# Patient Record
Sex: Female | Born: 1950 | Race: White | Hispanic: No | Smoking: Former smoker
Health system: Southern US, Community
[De-identification: ages and names within clinical notes are randomized; demographics above are authoritative.]

## PROBLEM LIST (undated history)

## (undated) DIAGNOSIS — E039 Hypothyroidism, unspecified: Secondary | ICD-10-CM

## (undated) DIAGNOSIS — F419 Anxiety disorder, unspecified: Secondary | ICD-10-CM

## (undated) DIAGNOSIS — Z720 Tobacco use: Secondary | ICD-10-CM

## (undated) DIAGNOSIS — K219 Gastro-esophageal reflux disease without esophagitis: Secondary | ICD-10-CM

## (undated) HISTORY — DX: Hypothyroidism, unspecified: E03.9

## (undated) HISTORY — DX: Anxiety disorder, unspecified: F41.9

## (undated) HISTORY — DX: Gastro-esophageal reflux disease without esophagitis: K21.9

## (undated) HISTORY — DX: Tobacco use: Z72.0

---

## 2002-04-09 ENCOUNTER — Emergency Department (HOSPITAL_COMMUNITY): Admission: EM | Admit: 2002-04-09 | Discharge: 2002-04-09 | Payer: Self-pay | Admitting: Emergency Medicine

## 2003-04-30 ENCOUNTER — Encounter
Admission: RE | Admit: 2003-04-30 | Discharge: 2003-07-29 | Payer: Self-pay | Admitting: Physical Medicine and Rehabilitation

## 2005-11-10 ENCOUNTER — Ambulatory Visit: Payer: Self-pay | Admitting: Endocrinology

## 2007-03-10 ENCOUNTER — Encounter: Payer: Self-pay | Admitting: Endocrinology

## 2007-03-10 DIAGNOSIS — E039 Hypothyroidism, unspecified: Secondary | ICD-10-CM

## 2007-03-10 DIAGNOSIS — F411 Generalized anxiety disorder: Secondary | ICD-10-CM | POA: Insufficient documentation

## 2011-01-13 ENCOUNTER — Encounter: Payer: Self-pay | Admitting: Family Medicine

## 2011-01-13 DIAGNOSIS — E559 Vitamin D deficiency, unspecified: Secondary | ICD-10-CM | POA: Insufficient documentation

## 2011-02-17 ENCOUNTER — Other Ambulatory Visit: Payer: Self-pay | Admitting: Family Medicine

## 2011-02-19 ENCOUNTER — Other Ambulatory Visit: Payer: Self-pay

## 2012-03-08 ENCOUNTER — Other Ambulatory Visit: Payer: Self-pay | Admitting: Family Medicine

## 2012-03-08 ENCOUNTER — Ambulatory Visit
Admission: RE | Admit: 2012-03-08 | Discharge: 2012-03-08 | Disposition: A | Discharge status: Home / Self Care | Payer: 59 | Source: Ambulatory Visit | Attending: Family Medicine | Admitting: Family Medicine

## 2012-03-08 DIAGNOSIS — F17200 Nicotine dependence, unspecified, uncomplicated: Secondary | ICD-10-CM

## 2012-03-08 DIAGNOSIS — R0989 Other specified symptoms and signs involving the circulatory and respiratory systems: Secondary | ICD-10-CM

## 2012-03-08 DIAGNOSIS — R05 Cough: Secondary | ICD-10-CM

## 2012-10-15 ENCOUNTER — Other Ambulatory Visit: Payer: Self-pay | Admitting: Physician Assistant

## 2012-10-17 NOTE — Telephone Encounter (Signed)
Medication refilled per protocol. 

## 2012-10-17 NOTE — Telephone Encounter (Signed)
Approved. #90+2 additional refills 

## 2012-10-17 NOTE — Telephone Encounter (Addendum)
Last  Refill 09/13/12.  Clonazepam 0.5mg  TID prn #90.  Last ov 08/24/12 Need approval for controlled medication.

## 2012-10-18 ENCOUNTER — Other Ambulatory Visit: Payer: Self-pay | Admitting: Physician Assistant

## 2012-10-18 NOTE — Telephone Encounter (Signed)
Medication refilled per protocol. 

## 2012-10-28 ENCOUNTER — Telehealth: Payer: Self-pay | Admitting: Physician Assistant

## 2012-10-28 MED ORDER — OMEPRAZOLE 20 MG PO CPDR: 20.0000 mg | DELAYED_RELEASE_CAPSULE | Freq: Every day | ORAL | Status: DC

## 2012-10-28 NOTE — Telephone Encounter (Signed)
Pt called stated pharmacy did not receive refill order, refilled order today.

## 2012-11-22 ENCOUNTER — Ambulatory Visit: Payer: Self-pay | Admitting: Family Medicine

## 2013-01-13 ENCOUNTER — Other Ambulatory Visit: Payer: Self-pay | Admitting: Physician Assistant

## 2013-01-13 NOTE — Telephone Encounter (Signed)
Last OV 08/24/12.  Last refill #90 + 2 refills 10/15/12.  Last saw MBD 09/2011 after that seeing Dr Modesto Charon.  Annual exam due October.  Hx General Anxiety Disorder. Need approval for controlled medication.

## 2013-01-13 NOTE — Telephone Encounter (Signed)
Refills called into pharmacy

## 2013-01-13 NOTE — Telephone Encounter (Signed)
Ok to refill 

## 2013-03-23 ENCOUNTER — Telehealth: Payer: Self-pay | Admitting: Family Medicine

## 2013-03-23 NOTE — Telephone Encounter (Signed)
Can u give me lov on this pt pls! Thanks

## 2013-03-23 NOTE — Telephone Encounter (Signed)
?   OK to Refill  

## 2013-03-23 NOTE — Telephone Encounter (Signed)
Klonopin 0.5 mg 1 TID prn #90

## 2013-03-23 NOTE — Telephone Encounter (Signed)
NTBS.

## 2013-03-23 NOTE — Telephone Encounter (Signed)
08/24/12 with Dr. Modesto Charon for problem..03/31/12 with Dr. Modesto Charon for a cpe

## 2013-03-24 NOTE — Telephone Encounter (Signed)
Patient aware and appt made 

## 2013-03-27 ENCOUNTER — Ambulatory Visit (INDEPENDENT_AMBULATORY_CARE_PROVIDER_SITE_OTHER): Payer: 59 | Admitting: Family Medicine

## 2013-03-27 ENCOUNTER — Encounter: Payer: Self-pay | Admitting: Family Medicine

## 2013-03-27 VITALS — BP 120/80 | HR 80 | Temp 98.0°F | Resp 20 | Wt 152.5 lb

## 2013-03-27 DIAGNOSIS — S8990XA Unspecified injury of unspecified lower leg, initial encounter: Secondary | ICD-10-CM

## 2013-03-27 DIAGNOSIS — F17200 Nicotine dependence, unspecified, uncomplicated: Secondary | ICD-10-CM

## 2013-03-27 DIAGNOSIS — Z0000 Encounter for general adult medical examination without abnormal findings: Secondary | ICD-10-CM

## 2013-03-27 DIAGNOSIS — Z13220 Encounter for screening for lipoid disorders: Secondary | ICD-10-CM

## 2013-03-27 DIAGNOSIS — F411 Generalized anxiety disorder: Secondary | ICD-10-CM

## 2013-03-27 DIAGNOSIS — Z23 Encounter for immunization: Secondary | ICD-10-CM

## 2013-03-27 DIAGNOSIS — E039 Hypothyroidism, unspecified: Secondary | ICD-10-CM

## 2013-03-27 DIAGNOSIS — L0292 Furuncle, unspecified: Secondary | ICD-10-CM

## 2013-03-27 DIAGNOSIS — S99922A Unspecified injury of left foot, initial encounter: Secondary | ICD-10-CM

## 2013-03-27 MED ORDER — CEPHALEXIN 500 MG PO CAPS: 500.0000 mg | ORAL_CAPSULE | Freq: Two times a day (BID) | ORAL | Status: DC

## 2013-03-27 MED ORDER — CLONAZEPAM 0.5 MG PO TABS: ORAL_TABLET | ORAL | Status: DC

## 2013-03-27 NOTE — Patient Instructions (Addendum)
Continue current  Medications Klonopin refilled Keflex for boil  Get the cholesterol done at [facility] center F/U as previous for GYN

## 2013-03-28 ENCOUNTER — Encounter: Payer: Self-pay | Admitting: Family Medicine

## 2013-03-28 DIAGNOSIS — L0292 Furuncle, unspecified: Secondary | ICD-10-CM | POA: Insufficient documentation

## 2013-03-28 DIAGNOSIS — S99929A Unspecified injury of unspecified foot, initial encounter: Secondary | ICD-10-CM | POA: Insufficient documentation

## 2013-03-28 DIAGNOSIS — F17200 Nicotine dependence, unspecified, uncomplicated: Secondary | ICD-10-CM | POA: Insufficient documentation

## 2013-03-28 LAB — CBC WITH DIFFERENTIAL/PLATELET
Basophils Relative: 1 % (ref 0–1)
Eosinophils Absolute: 0.1 10*3/uL (ref 0.0–0.7)
HCT: 35.8 % — ABNORMAL LOW (ref 36.0–46.0)
Hemoglobin: 12.1 g/dL (ref 12.0–15.0)
Lymphs Abs: 2.3 10*3/uL (ref 0.7–4.0)
MCH: 33.5 pg (ref 26.0–34.0)
MCHC: 33.8 g/dL (ref 30.0–36.0)
Monocytes Absolute: 0.5 10*3/uL (ref 0.1–1.0)
Monocytes Relative: 6 % (ref 3–12)
RBC: 3.61 MIL/uL — ABNORMAL LOW (ref 3.87–5.11)

## 2013-03-28 LAB — COMPREHENSIVE METABOLIC PANEL
AST: 24 U/L (ref 0–37)
Alkaline Phosphatase: 53 U/L (ref 39–117)
BUN: 13 mg/dL (ref 6–23)
Creat: 1.01 mg/dL (ref 0.50–1.10)
Glucose, Bld: 81 mg/dL (ref 70–99)
Total Bilirubin: 0.6 mg/dL (ref 0.3–1.2)

## 2013-03-28 LAB — TSH: TSH: 1.305 u[IU]/mL (ref 0.350–4.500)

## 2013-03-28 LAB — T4, FREE: Free T4: 1.32 ng/dL (ref 0.80–1.80)

## 2013-03-28 NOTE — Assessment & Plan Note (Signed)
Doing well on meds, refilled 

## 2013-03-28 NOTE — Progress Notes (Signed)
  Subjective:    Patient ID: Wendy Mata, female    DOB: [birth year], 62 y.o.   MRN: [id number]  HPI Pt here to f/u medications. COncerned about boil in groin for past couple of days, had one in left side now resolved, little drainage earlier this week, tender to touch. Anxiety- chronic anxiety, takes klonopin three times a day, has been on for some time, tried a few SSRI did not tolerate per report.  Hypothyroidism due for TFT Toe injury- last week stumped toe, noticed pain and some swelling, pain now resolved but has bruising, no pain with touch toe, bending or walking   Review of Systems  GEN- denies fatigue, fever, weight loss,weakness, recent illness HEENT- denies eye drainage, change in vision, nasal discharge, CVS- denies chest pain, palpitations RESP- denies SOB, cough, wheeze ABD- denies N/V, change in stools, abd pain GU- denies dysuria, hematuria, dribbling, incontinence MSK- denies joint pain, muscle aches, injury Neuro- denies headache, dizziness, syncope, seizure activity      Objective:   Physical Exam GEN- NAD, alert and oriented x3 HEENT- PERRL, EOMI, non injected sclera, pink conjunctiva, MMM, oropharynx clear Neck- Supple, no thyromegaly CVS- RRR, no murmur RESP-CTAB Skin- Right groin small boil, no drainage, minimal fluctuance, mild erythema EXT- No edema, left foot 4th digit +bruising, no pain on palpation of toe, normal ROM, nail in tact Pulses- Radial, DP- 2+        Assessment & Plan:

## 2013-03-28 NOTE — Assessment & Plan Note (Signed)
Counseled on cessation, not ready to quit.  

## 2013-03-28 NOTE — Assessment & Plan Note (Signed)
Check TFT's continue meds

## 2013-03-28 NOTE — Assessment & Plan Note (Signed)
Keflex, warm compress

## 2013-03-28 NOTE — Assessment & Plan Note (Addendum)
I think this is soft tissue injury, no pain, ambulating normal  Hold on imaging

## 2013-03-30 ENCOUNTER — Encounter: Payer: Self-pay | Admitting: Family Medicine

## 2013-04-12 ENCOUNTER — Ambulatory Visit (INDEPENDENT_AMBULATORY_CARE_PROVIDER_SITE_OTHER): Payer: 59 | Admitting: Family Medicine

## 2013-04-12 ENCOUNTER — Encounter: Payer: Self-pay | Admitting: Family Medicine

## 2013-04-12 VITALS — BP 120/72 | HR 88 | Temp 98.5°F | Resp 20 | Ht 62.0 in | Wt 156.0 lb

## 2013-04-12 DIAGNOSIS — N951 Menopausal and female climacteric states: Secondary | ICD-10-CM

## 2013-04-12 DIAGNOSIS — Z780 Asymptomatic menopausal state: Secondary | ICD-10-CM

## 2013-04-12 DIAGNOSIS — Z0000 Encounter for general adult medical examination without abnormal findings: Secondary | ICD-10-CM

## 2013-04-12 DIAGNOSIS — Z124 Encounter for screening for malignant neoplasm of cervix: Secondary | ICD-10-CM

## 2013-04-12 NOTE — Patient Instructions (Signed)
I recommend eye visit once a year I recommend dental visit every 6 months Goal is to  Exercise 30 minutes 5 days a week We will call with cholesterol lab results and PAP smear results F/U 6 months for medications

## 2013-04-12 NOTE — Assessment & Plan Note (Signed)
On hormone therapy Will need Bone Density with Mammogram next year

## 2013-04-12 NOTE — Progress Notes (Signed)
  Subjective:    Patient ID: Wendy Mata, female    DOB: November 29, 1950, 62 y.o.   MRN: [id number]  HPI  Patient here for CPE and Pap smear. Her last Pap smear was last year which was normal. She is also up-to-date on her mammogram. Her immunizations are up-to-date . Labs were reviewed.  She's not had a colonoscopy in and is still considering this. Her lipid panel is still outstanding she will have this done this week. She is is menopausal   Review of Systems GEN- denies fatigue, fever, weight loss,weakness, recent illness HEENT- denies eye drainage, change in vision, nasal discharge, CVS- denies chest pain, palpitations RESP- denies SOB, cough, wheeze ABD- denies N/V, change in stools, abd pain GU- denies dysuria, hematuria, dribbling, incontinence MSK- denies joint pain, muscle aches, injury Neuro- denies headache, dizziness, syncope, seizure activity      Objective:   Physical Exam GEN- NAD, alert and oriented x3 HEENT- PERRL, EOMI, non injected sclera, pink conjunctiva, MMM, oropharynx clear Neck- Supple, no thryomegaly Breast- normal symmetry, no nipple inversion,no nipple drainage, no nodules or lumps felt Nodes- no axillary nodes CVS- RRR, no murmur RESP-CTAB ABD-NABS,soft,NT,ND GU- normal external genitalia, vaginal mucosa pink and moist, cervix visualized no growth, no blood form os, No discharge, no CMT, no ovarian masses, uterus normal size Rectum- Normal tone, FOBT neg, external skin tags EXT- No edema Pulses- Radial, DP- 2+        Assessment & Plan:   CPE- PAP smear done, discussed colonoscopy pt wants to wait and have done in 2015. Mammo UTD  Get FLP, f/u 6 months  Continue exercise, on calcium

## 2013-04-13 LAB — PAP THINPREP ASCUS RFLX HPV RFLX TYPE

## 2013-04-24 ENCOUNTER — Telehealth: Payer: Self-pay | Admitting: Family Medicine

## 2013-04-24 MED ORDER — LEVOTHYROXINE SODIUM 100 MCG PO TABS: 100.0000 ug | ORAL_TABLET | Freq: Every day | ORAL | Status: DC

## 2013-04-24 NOTE — Telephone Encounter (Signed)
Patient needs her Synthroid refilled . ASAP.    [facility].

## 2013-04-24 NOTE — Telephone Encounter (Signed)
RX sent

## 2013-05-23 ENCOUNTER — Encounter: Payer: Self-pay | Admitting: Family Medicine

## 2013-05-23 MED ORDER — CEPHALEXIN 500 MG PO CAPS: 500.0000 mg | ORAL_CAPSULE | Freq: Two times a day (BID) | ORAL | Status: DC

## 2013-05-23 NOTE — Telephone Encounter (Signed)
Rx sent.  Pt notified per "MyChart"

## 2013-06-01 ENCOUNTER — Telehealth: Payer: Self-pay | Admitting: Family Medicine

## 2013-06-01 ENCOUNTER — Other Ambulatory Visit: Payer: Self-pay | Admitting: *Deleted

## 2013-06-01 MED ORDER — NORETHINDRONE-ETH ESTRADIOL 1-5 MG-MCG PO TABS: 1.0000 | ORAL_TABLET | Freq: Every day | ORAL | Status: DC

## 2013-06-01 NOTE — Telephone Encounter (Signed)
Called pt and informed her medication was sent in on the 3 of December to [facility] [employer]

## 2013-06-01 NOTE — Telephone Encounter (Signed)
Pt states that pharmacy has faxed several request for a refill on her Femhrt but they have not heard back from the request  Pt would like for you to call her back and let her know what is going on  Pharmacy [facility] Call back number is [phone]

## 2013-06-19 ENCOUNTER — Encounter: Payer: Self-pay | Admitting: Family Medicine

## 2013-07-27 ENCOUNTER — Encounter: Payer: Self-pay | Admitting: Family Medicine

## 2013-07-28 MED ORDER — CLONAZEPAM 0.5 MG PO TABS: ORAL_TABLET | ORAL | Status: DC

## 2013-07-28 NOTE — Telephone Encounter (Signed)
Rx printed for provider signature and faxed to pharmacy

## 2013-09-06 ENCOUNTER — Telehealth: Payer: Self-pay | Admitting: Family Medicine

## 2013-09-06 MED ORDER — NORETHINDRONE-ETH ESTRADIOL 1-5 MG-MCG PO TABS: 1.0000 | ORAL_TABLET | Freq: Every day | ORAL | Status: DC

## 2013-09-06 NOTE — Telephone Encounter (Signed)
Refill appropriate and filled per protocol. 

## 2013-09-06 NOTE — Telephone Encounter (Signed)
Hormone medicine has not been refilled. And she is going out of town on Friday and would like to have it before she leaves. She did not know the name of the medication Pharmacy [facility] Call back number is [phone]

## 2013-10-11 ENCOUNTER — Ambulatory Visit: Payer: 59 | Admitting: Family Medicine

## 2013-10-16 ENCOUNTER — Encounter: Payer: Self-pay | Admitting: Family Medicine

## 2013-10-16 ENCOUNTER — Other Ambulatory Visit: Payer: Self-pay | Admitting: *Deleted

## 2013-10-16 MED ORDER — LEVOTHYROXINE SODIUM 100 MCG PO TABS: 100.0000 ug | ORAL_TABLET | Freq: Every day | ORAL | Status: DC

## 2013-10-16 NOTE — Telephone Encounter (Signed)
Refill appropriate and filled per protocol. 

## 2013-10-25 ENCOUNTER — Ambulatory Visit (INDEPENDENT_AMBULATORY_CARE_PROVIDER_SITE_OTHER): Payer: 59 | Admitting: Family Medicine

## 2013-10-25 ENCOUNTER — Encounter: Payer: Self-pay | Admitting: Family Medicine

## 2013-10-25 VITALS — BP 130/72 | HR 64 | Temp 97.7°F | Resp 16 | Ht 63.5 in | Wt 153.0 lb

## 2013-10-25 DIAGNOSIS — E039 Hypothyroidism, unspecified: Secondary | ICD-10-CM

## 2013-10-25 DIAGNOSIS — F411 Generalized anxiety disorder: Secondary | ICD-10-CM

## 2013-10-25 DIAGNOSIS — F17200 Nicotine dependence, unspecified, uncomplicated: Secondary | ICD-10-CM

## 2013-10-25 DIAGNOSIS — Z13220 Encounter for screening for lipoid disorders: Secondary | ICD-10-CM

## 2013-10-25 LAB — LIPID PANEL
Cholesterol: 176 mg/dL (ref 0–200)
HDL: 63 mg/dL (ref >39)
LDL Cholesterol: 96 mg/dL (ref 0–99)
Total CHOL/HDL Ratio: 2.8 Ratio
Triglycerides: 85 mg/dL (ref <150)
VLDL: 17 mg/dL (ref 0–40)

## 2013-10-25 LAB — TSH: TSH: 2.16 u[IU]/mL (ref 0.350–4.500)

## 2013-10-25 LAB — T4, FREE: FREE T4: 1.17 ng/dL (ref 0.80–1.80)

## 2013-10-25 LAB — T3, FREE: T3, Free: 2.6 pg/mL (ref 2.3–4.2)

## 2013-10-25 MED ORDER — CLONAZEPAM 0.5 MG PO TABS: ORAL_TABLET | ORAL | Status: DC

## 2013-10-25 MED ORDER — OMEPRAZOLE 20 MG PO CPDR: 20.0000 mg | DELAYED_RELEASE_CAPSULE | Freq: Every day | ORAL | Status: DC

## 2013-10-25 NOTE — Assessment & Plan Note (Signed)
Overall her anxiety is well controlled she rarely uses the Xanax. Her fatigue may be coming from her recent stressful event for her daughter's wedding.

## 2013-10-25 NOTE — Progress Notes (Signed)
Patient ID: Wendy Mata, female   DOB: 10-Dec-1950, 63 y.o.   MRN: [id number]   Subjective:    Patient ID: Wendy Mata, female    DOB: 07/17/1950, 63 y.o.   MRN: [id number]  Patient presents for F/U  is here follow chronic medical problems. She has noted that she's had some more cold and heat intolerance and typically in a little bit more fatigued therefore she would like to have her thyroid levels rechecked. She did have like going on with her daughter's wedding but this is now over and she fell/stress. She's using her klonopin but very rarely. She will like to hold off into the fall for her bone density and colonoscopy.    Review Of Systems:  GEN-+atigue, fever, weight loss,weakness, recent illness HEENT- denies eye drainage, change in vision, nasal discharge, CVS- denies chest pain, palpitations RESP- denies SOB, cough, wheeze ABD- denies N/V, change in stools, abd pain GU- denies dysuria, hematuria, dribbling, incontinence MSK- denies joint pain, muscle aches, injury Neuro- denies headache, dizziness, syncope, seizure activity       Objective:    BP 130/72  Pulse 64  Temp(Src) 97.7 F (36.5 C) (Oral)  Resp 16  Ht 5' 3.5" (1.613 m)  Wt 153 lb (69.4 kg)  BMI 26.67 kg/m2 GEN- NAD, alert and oriented x3 HEENT- PERRL, EOMI, non injected sclera, pink conjunctiva, MMM, oropharynx clear Neck- Supple, no thyromegaly CVS- RRR, no murmur RESP-CTAB EXT- No edema Pulses- Radial 2+        Assessment & Plan:      Problem List Items Addressed This Visit   Screening cholesterol level   HYPOTHYROIDISM - Primary   Relevant Orders      TSH      T3, Free      T4, Free   ANXIETY      Note: This dictation was prepared with Dragon dictation along with smaller phrase technology. Any transcriptional errors that result from this process are unintentional.

## 2013-10-25 NOTE — Patient Instructions (Signed)
Continue current medication  F/U 6 months- Physical

## 2013-10-25 NOTE — Assessment & Plan Note (Signed)
Continues to smoke continue to work on cessation

## 2013-10-25 NOTE — Assessment & Plan Note (Signed)
Recheck thyroid function testing we'll adjust medications as needed

## 2013-10-26 ENCOUNTER — Encounter: Payer: Self-pay | Admitting: Family Medicine

## 2013-11-14 ENCOUNTER — Encounter: Payer: Self-pay | Admitting: Family Medicine

## 2013-11-17 NOTE — Telephone Encounter (Signed)
I spoke with pt, there was some confusion about her last urine sample, this was not done at our last visit, though she had left a sample She wanted surveillance as she has had hematuria and protein in her urine before, and had to see urology though work up was fairly benign besides a small cystic lesion on kidney.   We both agreed to repeat her urine sample at her physical this fall,

## 2014-02-12 ENCOUNTER — Telehealth: Payer: Self-pay | Admitting: Family Medicine

## 2014-02-12 MED ORDER — CLONAZEPAM 0.5 MG PO TABS: ORAL_TABLET | ORAL | Status: DC

## 2014-02-12 NOTE — Telephone Encounter (Signed)
Patient wants to know why we denied her klonopin please call her back at [phone]

## 2014-02-12 NOTE — Telephone Encounter (Signed)
Prescription called to pharmacy on 10/25/2013, #90 with 3 refills.   Patient states that she received refill on 01/10/2014, and no further refills remained.   Call placed to pharmacy, but pharmacy was not open yet.   Will call back.

## 2014-02-12 NOTE — Telephone Encounter (Signed)
Call placed to pharmacy.   States that prescription on 4/29 was not received.   Last fill 01/09/2014.  MD made aware and approved refill.

## 2014-02-14 ENCOUNTER — Encounter: Payer: Self-pay | Admitting: Family Medicine

## 2014-02-14 ENCOUNTER — Telehealth: Payer: Self-pay | Admitting: Family Medicine

## 2014-02-14 ENCOUNTER — Ambulatory Visit (INDEPENDENT_AMBULATORY_CARE_PROVIDER_SITE_OTHER): Payer: 59 | Admitting: Family Medicine

## 2014-02-14 VITALS — BP 138/78 | HR 68 | Temp 99.2°F | Resp 14 | Ht 63.0 in | Wt 153.0 lb

## 2014-02-14 DIAGNOSIS — J209 Acute bronchitis, unspecified: Secondary | ICD-10-CM

## 2014-02-14 DIAGNOSIS — F17200 Nicotine dependence, unspecified, uncomplicated: Secondary | ICD-10-CM

## 2014-02-14 DIAGNOSIS — Z20818 Contact with and (suspected) exposure to other bacterial communicable diseases: Secondary | ICD-10-CM

## 2014-02-14 DIAGNOSIS — Z2089 Contact with and (suspected) exposure to other communicable diseases: Secondary | ICD-10-CM

## 2014-02-14 LAB — RAPID STREP SCREEN (MED CTR MEBANE ONLY): Streptococcus, Group A Screen (Direct): NEGATIVE

## 2014-02-14 MED ORDER — ALBUTEROL SULFATE HFA 108 (90 BASE) MCG/ACT IN AERS: 2.0000 | INHALATION_SPRAY | RESPIRATORY_TRACT | Status: DC | PRN

## 2014-02-14 MED ORDER — PREDNISONE 20 MG PO TABS: 40.0000 mg | ORAL_TABLET | Freq: Every day | ORAL | Status: DC

## 2014-02-14 MED ORDER — DOXYCYCLINE HYCLATE 100 MG PO TABS: 100.0000 mg | ORAL_TABLET | Freq: Two times a day (BID) | ORAL | Status: DC

## 2014-02-14 NOTE — Telephone Encounter (Signed)
Message was handled via E-mail.

## 2014-02-14 NOTE — Telephone Encounter (Signed)
Patient has already left email for dr Buelah Manis but left this message as well, that the prednisone that we wrote for her was wrong or the pharmacy was wrong please call her back at [phone]

## 2014-02-15 NOTE — Progress Notes (Signed)
Patient ID: Wendy Mata, female   DOB: 07-18-1950, 63 y.o.   MRN: [id number]   Subjective:    Patient ID: Wendy Mata, female    DOB: 11/04/1950, 63 y.o.   MRN: [id number]  Patient presents for Illness Pt here with cough with production ,wheezing, sore throat for the past week. Multiple sick contacts with work employees including strep throat. She continues to smoke. Lamonte Sakai been using OTC nyquil, her albuterol inhaler expired. Wheezing worse at night. Denies SOB with exertion.    Review Of Systems:  GEN- denies fatigue, fever, weight loss,weakness, recent illness HEENT- denies eye drainage, change in vision, nasal discharge, CVS- denies chest pain, palpitations RESP- denies SOB,+ cough, +wheeze Neuro- denies headache, dizziness, syncope, seizure activity       Objective:    BP 138/78  Pulse 68  Temp(Src) 99.2 F (37.3 C) (Oral)  Resp 14  Ht 5\' 3"  (1.6 m)  Wt 153 lb (69.4 kg)  BMI 27.11 kg/m2 GEN- NAD, alert and oriented x3 HEENT- PERRL, EOMI, non injected sclera, pink conjunctiva, MMM, oropharynx mild injection, TM clear bilat no effusion, no maxillary sinus tenderness, nares clear Neck- Supple, no LAD CVS- RRR, no murmur RESP-course BS, no wheeze, normal WOB, no rales EXT- No edema Pulses- Radial 2+          Assessment & Plan:      Problem List Items Addressed This Visit   None    Visit Diagnoses   Exposure to strep throat    -  Primary    neg strep test, multuple exposores noted    Relevant Orders       Rapid Strep Screen (Completed)    Acute bronchitis, unspecified organism        Treat with prednisone, add antibiotics based on history and smokign, mucinex DM       Note: This dictation was prepared with Dragon dictation along with smaller phrase technology. Any transcriptional errors that result from this process are unintentional.

## 2014-04-09 ENCOUNTER — Other Ambulatory Visit: Payer: Self-pay | Admitting: *Deleted

## 2014-04-09 MED ORDER — LEVOTHYROXINE SODIUM 100 MCG PO TABS: 100.0000 ug | ORAL_TABLET | Freq: Every day | ORAL | Status: DC

## 2014-04-09 NOTE — Telephone Encounter (Signed)
Received fax requesting refill on Synthroid.   Refill appropriate and filled per protocol. 

## 2014-04-18 ENCOUNTER — Telehealth: Payer: Self-pay | Admitting: Family Medicine

## 2014-04-18 MED ORDER — SYNTHROID 100 MCG PO TABS: 100.0000 ug | ORAL_TABLET | Freq: Every day | ORAL | Status: DC

## 2014-04-18 NOTE — Telephone Encounter (Signed)
Call placed to patient.   Reports that she has been trying to take generic Levothyroxine, but she has been feeling very fatigued.   New prescription sent to pharmacy for Name Brand Only Synthroid.

## 2014-04-18 NOTE — Telephone Encounter (Signed)
Patient is calling to speak with you about her synthroid, she thinks she is allergic to it  Please call her at [phone]

## 2014-05-02 ENCOUNTER — Encounter: Payer: Self-pay | Admitting: Family Medicine

## 2014-05-02 ENCOUNTER — Ambulatory Visit (INDEPENDENT_AMBULATORY_CARE_PROVIDER_SITE_OTHER): Payer: 59 | Admitting: Family Medicine

## 2014-05-02 VITALS — BP 122/78 | HR 78 | Temp 98.6°F | Resp 16 | Ht 63.0 in | Wt 154.0 lb

## 2014-05-02 DIAGNOSIS — Z720 Tobacco use: Secondary | ICD-10-CM

## 2014-05-02 DIAGNOSIS — J0100 Acute maxillary sinusitis, unspecified: Secondary | ICD-10-CM

## 2014-05-02 DIAGNOSIS — F17200 Nicotine dependence, unspecified, uncomplicated: Secondary | ICD-10-CM

## 2014-05-02 MED ORDER — LEVOFLOXACIN 500 MG PO TABS: 500.0000 mg | ORAL_TABLET | Freq: Every day | ORAL | Status: DC

## 2014-05-02 NOTE — Patient Instructions (Addendum)
Take antibiotics for sinus infection  Use mucinex DM F/U 2-3 weeks for PHYSICAL FASTING

## 2014-05-02 NOTE — Progress Notes (Signed)
Patient ID: Wendy Mata, female   DOB: 06-Jun-1951, 63 y.o.   MRN: [id number]   Subjective:    Patient ID: Wendy Mata, female    DOB: 26-Jul-1950, 63 y.o.   MRN: [id number]  Patient presents for Illness patient here with sinus drainage and pressure worsening over the past 2 weeks she also has postnasal drip. She's been using nasal saline and taken NyQuil cough she has is mostly at night due to drainage. She has not had any wheezing or shortness of breath. She did have a low-grade fever the other week. Positive sick contacts with people at work.    Review Of Systems:  GEN- denies fatigue,+ fever, weight loss,weakness, recent illness HEENT- denies eye drainage, change in vision, +nasal discharge, CVS- denies chest pain, palpitations RESP- denies SOB, cough, wheeze ABD- denies N/V, change in stools, abd pain Neuro- denies headache, dizziness, syncope, seizure activity       Objective:    BP 122/78 mmHg  Pulse 78  Temp(Src) 98.6 F (37 C) (Oral)  Resp 16  Ht 5\' 3"  (1.6 m)  Wt 154 lb (69.854 kg)  BMI 27.29 kg/m2 GEN- NAD, alert and oriented x3 HEENT- PERRL, EOMI, non injected sclera, pink conjunctiva, MMM, oropharynx mild injection, TM clear bilat no effusion,  + maxillary sinus tenderness, inflammed turbinates,  Nasal drainage  Neck- Supple, no LAD CVS- RRR, no murmur RESP-occasional expiratory wheeze EXT- No edema Pulses- Radial 2+          Assessment & Plan:      Problem List Items Addressed This Visit    None    Visit Diagnoses    Acute maxillary sinusitis, recurrence not specified    -  Primary    Relevant Medications       levofloxacin (LEVAQUIN) tablet       Note: This dictation was prepared with Dragon dictation along with smaller phrase technology. Any transcriptional errors that result from this process are unintentional.

## 2014-05-16 ENCOUNTER — Encounter: Payer: Self-pay | Admitting: Family Medicine

## 2014-05-21 ENCOUNTER — Encounter: Payer: Self-pay | Admitting: Family Medicine

## 2014-05-21 ENCOUNTER — Ambulatory Visit (INDEPENDENT_AMBULATORY_CARE_PROVIDER_SITE_OTHER): Payer: 59 | Admitting: Family Medicine

## 2014-05-21 VITALS — BP 128/72 | HR 66 | Temp 97.9°F | Resp 14 | Ht 63.0 in | Wt 155.0 lb

## 2014-05-21 DIAGNOSIS — Z13820 Encounter for screening for osteoporosis: Secondary | ICD-10-CM

## 2014-05-21 DIAGNOSIS — E038 Other specified hypothyroidism: Secondary | ICD-10-CM

## 2014-05-21 DIAGNOSIS — Z720 Tobacco use: Secondary | ICD-10-CM

## 2014-05-21 DIAGNOSIS — R3129 Other microscopic hematuria: Secondary | ICD-10-CM

## 2014-05-21 DIAGNOSIS — E559 Vitamin D deficiency, unspecified: Secondary | ICD-10-CM

## 2014-05-21 DIAGNOSIS — R312 Other microscopic hematuria: Secondary | ICD-10-CM

## 2014-05-21 DIAGNOSIS — F17200 Nicotine dependence, unspecified, uncomplicated: Secondary | ICD-10-CM

## 2014-05-21 DIAGNOSIS — Z0000 Encounter for general adult medical examination without abnormal findings: Secondary | ICD-10-CM

## 2014-05-21 DIAGNOSIS — Z23 Encounter for immunization: Secondary | ICD-10-CM

## 2014-05-21 LAB — CBC WITH DIFFERENTIAL/PLATELET
Basophils Absolute: 0.1 10*3/uL (ref 0.0–0.1)
Basophils Relative: 1 % (ref 0–1)
Eosinophils Absolute: 0.2 10*3/uL (ref 0.0–0.7)
Eosinophils Relative: 2 % (ref 0–5)
HCT: 39 % (ref 36.0–46.0)
HEMOGLOBIN: 13.2 g/dL (ref 12.0–15.0)
LYMPHS ABS: 2.3 10*3/uL (ref 0.7–4.0)
LYMPHS PCT: 28 % (ref 12–46)
MCH: 34.3 pg — ABNORMAL HIGH (ref 26.0–34.0)
MCHC: 33.8 g/dL (ref 30.0–36.0)
MCV: 101.3 fL — ABNORMAL HIGH (ref 78.0–100.0)
MONOS PCT: 7 % (ref 3–12)
MPV: 10.2 fL (ref 9.4–12.4)
Monocytes Absolute: 0.6 10*3/uL (ref 0.1–1.0)
NEUTROS ABS: 5 10*3/uL (ref 1.7–7.7)
NEUTROS PCT: 62 % (ref 43–77)
Platelets: 306 10*3/uL (ref 150–400)
RBC: 3.85 MIL/uL — AB (ref 3.87–5.11)
RDW: 13.5 % (ref 11.5–15.5)
WBC: 8.1 10*3/uL (ref 4.0–10.5)

## 2014-05-21 LAB — URINALYSIS, ROUTINE W REFLEX MICROSCOPIC
Bilirubin Urine: NEGATIVE
Glucose, UA: NEGATIVE mg/dL
Ketones, ur: NEGATIVE mg/dL
LEUKOCYTES UA: NEGATIVE
NITRITE: NEGATIVE
PH: 5.5 (ref 5.0–8.0)
Protein, ur: NEGATIVE mg/dL
Urobilinogen, UA: 0.2 mg/dL (ref 0.0–1.0)

## 2014-05-21 LAB — COMPREHENSIVE METABOLIC PANEL
ALT: 16 U/L (ref 0–35)
AST: 22 U/L (ref 0–37)
Albumin: 4 g/dL (ref 3.5–5.2)
Alkaline Phosphatase: 56 U/L (ref 39–117)
BUN: 17 mg/dL (ref 6–23)
CALCIUM: 9.7 mg/dL (ref 8.4–10.5)
CHLORIDE: 105 meq/L (ref 96–112)
CO2: 21 meq/L (ref 19–32)
Creat: 0.95 mg/dL (ref 0.50–1.10)
Glucose, Bld: 87 mg/dL (ref 70–99)
POTASSIUM: 5 meq/L (ref 3.5–5.3)
Sodium: 137 mEq/L (ref 135–145)
TOTAL PROTEIN: 6.5 g/dL (ref 6.0–8.3)
Total Bilirubin: 0.6 mg/dL (ref 0.2–1.2)

## 2014-05-21 LAB — URINALYSIS, MICROSCOPIC ONLY
Casts: NONE SEEN
Crystals: NONE SEEN
WBC, UA: NONE SEEN WBC/hpf (ref <3)

## 2014-05-21 LAB — TSH: TSH: 2.277 u[IU]/mL (ref 0.350–4.500)

## 2014-05-21 LAB — T3, FREE: T3 FREE: 2.6 pg/mL (ref 2.3–4.2)

## 2014-05-21 LAB — T4, FREE: Free T4: 1.01 ng/dL (ref 0.80–1.80)

## 2014-05-21 MED ORDER — SYNTHROID 100 MCG PO TABS: 100.0000 ug | ORAL_TABLET | Freq: Every day | ORAL | Status: DC

## 2014-05-21 MED ORDER — CLONAZEPAM 0.5 MG PO TABS: ORAL_TABLET | ORAL | Status: DC

## 2014-05-21 NOTE — Patient Instructions (Addendum)
Bone Density Flu shot and pneumonia shot given Continue current medications  Stool cards given Bone Density to be done Flu shot and pneumonia shot given  F/U 6 months

## 2014-05-21 NOTE — Assessment & Plan Note (Signed)
Counseled on cessation 

## 2014-05-21 NOTE — Progress Notes (Signed)
Patient ID: Wendy Mata, female   DOB: 13-Nov-1950, 64 y.o.   MRN: [id number]   Subjective:    Patient ID: Wendy Mata, female    DOB: 04/20/1951, 63 y.o.   MRN: [id number]  Patient presents for CPE  Patient here for complete physical exam. She has no specific concerns. Pap smear in 2014 which was normal Pap smear for another 2 years. Her mammogram is up-to-date was normal. She has not had colonoscopy. She has not had bone density recently. She is a smoker. Higher risk of bone loss Due for flu shot Due for pneumonia 23 due to smoking History of microscopic hematuria would like urine checked today  Review Of Systems:  GEN- denies fatigue, fever, weight loss,weakness, recent illness HEENT- denies eye drainage, change in vision, nasal discharge, CVS- denies chest pain, palpitations RESP- denies SOB, cough, wheeze ABD- denies N/V, change in stools, abd pain GU- denies dysuria, hematuria, dribbling, incontinence MSK- denies joint pain, muscle aches, injury Neuro- denies headache, dizziness, syncope, seizure activity       Objective:    BP 128/72 mmHg  Pulse 66  Temp(Src) 97.9 F (36.6 C) (Oral)  Resp 14  Ht 5\' 3"  (1.6 m)  Wt 155 lb (70.308 kg)  BMI 27.46 kg/m2 GEN- NAD, alert and oriented x3 HEENT- PERRL, EOMI, non injected sclera, pink conjunctiva, MMM, oropharynx clear Neck- Supple, no thyromegaly CVS- RRR, no murmur RESP-CTAB ABD-NABS,soft,NT,ND EXT- No edema Pulses- Radial, DP- 2+        Assessment & Plan:      Problem List Items Addressed This Visit    Vitamin D deficiency   Relevant Orders      Vitamin D, 25-hydroxy   Tobacco use disorder   Hypothyroidism   Relevant Orders      TSH      T3, free      T4, free    Other Visit Diagnoses    Routine general medical examination at a health care facility    -  Primary    Bone Density to be done, Fasting labs, flu and pneumonia shot given, mammogram UTD/ PAP UTD, stool cards for colon screenong    Relevant  Orders       CBC with Differential       Comprehensive metabolic panel    Microscopic hematuria        Relevant Orders       Urinalysis, Routine w reflex microscopic       Note: This dictation was prepared with Dragon dictation along with smaller phrase technology. Any transcriptional errors that result from this process are unintentional.

## 2014-05-22 ENCOUNTER — Encounter: Payer: Self-pay | Admitting: Family Medicine

## 2014-05-22 LAB — VITAMIN D 25 HYDROXY (VIT D DEFICIENCY, FRACTURES): Vit D, 25-Hydroxy: 58 ng/mL (ref 30–100)

## 2014-05-31 ENCOUNTER — Encounter: Payer: Self-pay | Admitting: *Deleted

## 2014-10-05 ENCOUNTER — Telehealth: Payer: Self-pay | Admitting: *Deleted

## 2014-10-05 MED ORDER — CLONAZEPAM 0.5 MG PO TABS: ORAL_TABLET | ORAL | Status: DC

## 2014-10-05 NOTE — Telephone Encounter (Signed)
Okay to refill? 

## 2014-10-05 NOTE — Telephone Encounter (Signed)
Medication called to pharmacy. 

## 2014-10-05 NOTE — Telephone Encounter (Signed)
Received fax requesting refill on Clonazepam.   Ok to refill??  Last office visit/ refill 05/21/2014, #3 refills.

## 2014-11-02 ENCOUNTER — Other Ambulatory Visit: Payer: Self-pay | Admitting: *Deleted

## 2014-11-02 MED ORDER — OMEPRAZOLE 20 MG PO CPDR: 20.0000 mg | DELAYED_RELEASE_CAPSULE | Freq: Every day | ORAL | Status: DC

## 2014-11-02 NOTE — Telephone Encounter (Signed)
Received fax requesting refill on Omeprazole.   Refill appropriate and filled per protocol.

## 2014-11-05 ENCOUNTER — Encounter: Payer: Self-pay | Admitting: Family Medicine

## 2014-11-05 ENCOUNTER — Ambulatory Visit (INDEPENDENT_AMBULATORY_CARE_PROVIDER_SITE_OTHER): Payer: 59 | Admitting: Family Medicine

## 2014-11-05 VITALS — BP 128/74 | HR 78 | Temp 98.6°F | Resp 16 | Ht 63.0 in | Wt 158.0 lb

## 2014-11-05 DIAGNOSIS — J0100 Acute maxillary sinusitis, unspecified: Secondary | ICD-10-CM

## 2014-11-05 MED ORDER — DOXYCYCLINE HYCLATE 100 MG PO TABS: 100.0000 mg | ORAL_TABLET | Freq: Two times a day (BID) | ORAL | Status: DC

## 2014-11-05 NOTE — Patient Instructions (Signed)
Take zyrtec D once a day  Use nasal saline for nose Prednisone shot today ANtibiotics as prescribed F/U as prescribed

## 2014-11-05 NOTE — Progress Notes (Signed)
Patient ID: Wendy Mata, female   DOB: Aug 06, 1950, 64 y.o.   MRN: [id number]   Subjective:    Patient ID: Wendy Mata, female    DOB: 1951-06-25, 64 y.o.   MRN: [id number]  Patient presents for Illness patient here with sinus congestion, drainage, headache worsening over past 10 days or so, has used nasal saline,allergy meds, decongestants, Afrin, cold products with no improvement. No significant fever but had chills. Mild cough which is non productive. +smoker.    Review Of Systems:  GEN- denies fatigue, +fever, weight loss,weakness, recent illness HEENT- denies eye drainage, change in vision,+ nasal discharge, CVS- denies chest pain, palpitations RESP- denies SOB,+ cough, wheeze ABD- denies N/V, change in stools, abd pain GU- denies dysuria, hematuria, dribbling, incontinence MSK- denies joint pain, muscle aches, injury Neuro- denies headache, dizziness, syncope, seizure activity       Objective:    BP 128/74 mmHg  Pulse 78  Temp(Src) 98.6 F (37 C) (Oral)  Resp 16  Ht 5\' 3"  (1.6 m)  Wt 158 lb (71.668 kg)  BMI 28.00 kg/m2 GEN- NAD, alert and oriented x3 HEENT- PERRL, EOMI, non injected sclera, pink conjunctiva, MMM, oropharynx mild injection, TM clear bilat no effusion,  + maxillary sinus tenderness, inflammed turbinates,  Nasal drainage  Neck- Supple, shotty LAD CVS- RRR, no murmur RESP-CTAB EXT- No edema Pulses- Radial 2+          Assessment & Plan:      Problem List Items Addressed This Visit    None    Visit Diagnoses    Acute maxillary sinusitis, recurrence not specified    -  Primary    Treat with doxy, did not tolerate levaquin, PCN allergy, Depo Medrol shot given in office, zyrtec D, nasal saline, d/c Afrin has overused    Relevant Medications    doxycycline (VIBRA-TABS) 100 MG tablet       Note: This dictation was prepared with Dragon dictation along with smaller phrase technology. Any transcriptional errors that result from this process are  unintentional.

## 2014-12-10 ENCOUNTER — Other Ambulatory Visit: Payer: Self-pay | Admitting: *Deleted

## 2014-12-10 MED ORDER — NORETHINDRONE-ETH ESTRADIOL 1-5 MG-MCG PO TABS: 1.0000 | ORAL_TABLET | Freq: Every day | ORAL | Status: DC

## 2014-12-10 NOTE — Telephone Encounter (Signed)
Received fax requesting refill on [place].   Refill appropriate and filled per protocol.

## 2015-01-29 ENCOUNTER — Other Ambulatory Visit: Payer: Self-pay | Admitting: Family Medicine

## 2015-01-29 NOTE — Telephone Encounter (Signed)
LRF 10/05/14 #90 + 3  LOV 5/59/16  OK refill?

## 2015-01-29 NOTE — Telephone Encounter (Signed)
Okay to refill? 

## 2015-01-30 MED ORDER — CLONAZEPAM 0.5 MG PO TABS: ORAL_TABLET | ORAL | Status: DC

## 2015-01-30 NOTE — Telephone Encounter (Signed)
Medication refilled per protocol. 

## 2015-03-27 ENCOUNTER — Encounter: Payer: Self-pay | Admitting: Family Medicine

## 2015-03-27 ENCOUNTER — Ambulatory Visit (INDEPENDENT_AMBULATORY_CARE_PROVIDER_SITE_OTHER): Payer: 59 | Admitting: Family Medicine

## 2015-03-27 VITALS — BP 132/70 | HR 88 | Temp 97.8°F | Resp 14 | Ht 63.0 in | Wt 158.0 lb

## 2015-03-27 DIAGNOSIS — J0100 Acute maxillary sinusitis, unspecified: Secondary | ICD-10-CM

## 2015-03-27 DIAGNOSIS — J209 Acute bronchitis, unspecified: Secondary | ICD-10-CM | POA: Diagnosis not present

## 2015-03-27 MED ORDER — METHYLPREDNISOLONE ACETATE 40 MG/ML IJ SUSP
40.0000 mg | Freq: Once | INTRAMUSCULAR | Status: AC
  Administered 2015-03-27: 40 mg via INTRAMUSCULAR

## 2015-03-27 MED ORDER — GUAIFENESIN-CODEINE 100-10 MG/5ML PO SOLN: 5.0000 mL | Freq: Four times a day (QID) | ORAL | Status: DC | PRN

## 2015-03-27 MED ORDER — DOXYCYCLINE HYCLATE 100 MG PO TABS: 100.0000 mg | ORAL_TABLET | Freq: Two times a day (BID) | ORAL | Status: DC

## 2015-03-27 NOTE — Patient Instructions (Addendum)
Take antibiotics as prescribed  Steroid shot given Continue zyrtec D with the flonase  Cough syrup given  F/U Physical, end of November

## 2015-03-27 NOTE — Progress Notes (Signed)
Patient ID: Wendy Mata, female   DOB: 1950-12-27, 64 y.o.   MRN: [id number]   Subjective:    Patient ID: Wendy Mata, female    DOB: [birth year], 64 y.o.   MRN: [id number]  Patient presents for Illness  patient here with worsening sinus pressure and congestion. Now with cough with sputum production. She has not used her albuterol a couple times. Positive sick contact with Fredrich Birks he recently traveled to [place]. She has been using nasal saline as well as the Mucinex with minimal improvement. Symptoms have been worsening over the past 2 weeks    Review Of Systems:  GEN- denies fatigue, fever, weight loss,weakness, recent illness HEENT- denies eye drainage, change in vision, +nasal discharge, CVS- denies chest pain, palpitations RESP- denies SOB, +cough, wheeze ABD- denies N/V, change in stools, abd pain GU- denies dysuria, hematuria, dribbling, incontinence MSK- denies joint pain, muscle aches, injury Neuro- denies headache, dizziness, syncope, seizure activity       Objective:    BP 132/70 mmHg  Pulse 88  Temp(Src) 97.8 F (36.6 C) (Oral)  Resp 14  Ht 5\' 3"  (1.6 m)  Wt 158 lb (71.668 kg)  BMI 28.00 kg/m2 GEN- NAD, alert and oriented x3 HEENT- PERRL, EOMI, non injected sclera, pink conjunctiva, MMM, oropharynx mild injection, TM clear bilat no effusion,  + maxillary sinus tenderness, inflammed turbinates,  Nasal drainage  Neck- Supple, shotty submanibular LAD CVS- RRR, no murmur RESP-CTAB EXT- No edema Pulses- Radial 2+          Assessment & Plan:      Problem List Items Addressed This Visit    None    Visit Diagnoses    Acute maxillary sinusitis, recurrence not specified    -  Primary    Pt with initial sinusitis now with bronchitis, in setting of tobacco use, doxy, depo Medrol, Flonase zyrtec D, cough syrup given    Relevant Medications    doxycycline (VIBRA-TABS) 100 MG tablet    guaiFENesin-codeine 100-10 MG/5ML syrup    methylPREDNISolone acetate  (DEPO-MEDROL) injection 40 mg (Completed)    Acute bronchitis, unspecified organism        Relevant Medications    methylPREDNISolone acetate (DEPO-MEDROL) injection 40 mg (Completed)       Note: This dictation was prepared with Dragon dictation along with smaller phrase technology. Any transcriptional errors that result from this process are unintentional.

## 2015-04-18 ENCOUNTER — Telehealth: Payer: Self-pay | Admitting: *Deleted

## 2015-04-18 MED ORDER — SYNTHROID 100 MCG PO TABS: 100.0000 ug | ORAL_TABLET | Freq: Every day | ORAL | Status: DC

## 2015-04-18 MED ORDER — OMEPRAZOLE 20 MG PO CPDR: 20.0000 mg | DELAYED_RELEASE_CAPSULE | Freq: Every day | ORAL | Status: DC

## 2015-04-18 NOTE — Telephone Encounter (Signed)
Received fax from [place] requesting refills with 90 day supplies of Omeprazole, Levothyroxine, and Clonazepam.  Omeprazole and Levothyroxine filled per protocol.   Ok to refill Clonazepam to mail order with 90 day supply?  Last office visit  Last refill

## 2015-04-19 NOTE — Telephone Encounter (Signed)
Call placed to patient and patient made aware.   States that she is agreeable to leaving Clonazepam at [facility].

## 2015-04-19 NOTE — Telephone Encounter (Signed)
Okay to refill non control for 90 days Has to have 30 days at a time for Klonopin

## 2015-05-30 ENCOUNTER — Telehealth: Payer: Self-pay | Admitting: *Deleted

## 2015-05-30 NOTE — Telephone Encounter (Signed)
Received fax requesting refill on Clonazepam.   Ok to refill??  Last office visit 03/27/2015.  Last refill 01/30/2015, #3 refills.

## 2015-05-31 MED ORDER — CLONAZEPAM 0.5 MG PO TABS: ORAL_TABLET | ORAL | Status: DC

## 2015-05-31 NOTE — Telephone Encounter (Signed)
Medication called to pharmacy. 

## 2015-05-31 NOTE — Telephone Encounter (Signed)
Okay 

## 2015-06-04 ENCOUNTER — Ambulatory Visit (INDEPENDENT_AMBULATORY_CARE_PROVIDER_SITE_OTHER): Payer: Commercial Managed Care - PPO | Admitting: Family Medicine

## 2015-06-04 ENCOUNTER — Encounter: Payer: Self-pay | Admitting: Family Medicine

## 2015-06-04 VITALS — BP 132/78 | HR 68 | Temp 98.3°F | Resp 16 | Ht 63.0 in | Wt 162.0 lb

## 2015-06-04 DIAGNOSIS — Z0000 Encounter for general adult medical examination without abnormal findings: Secondary | ICD-10-CM | POA: Diagnosis not present

## 2015-06-04 DIAGNOSIS — Z23 Encounter for immunization: Secondary | ICD-10-CM

## 2015-06-04 DIAGNOSIS — Z1159 Encounter for screening for other viral diseases: Secondary | ICD-10-CM

## 2015-06-04 DIAGNOSIS — Z13220 Encounter for screening for lipoid disorders: Secondary | ICD-10-CM | POA: Diagnosis not present

## 2015-06-04 DIAGNOSIS — F411 Generalized anxiety disorder: Secondary | ICD-10-CM

## 2015-06-04 DIAGNOSIS — Z1239 Encounter for other screening for malignant neoplasm of breast: Secondary | ICD-10-CM | POA: Diagnosis not present

## 2015-06-04 DIAGNOSIS — R062 Wheezing: Secondary | ICD-10-CM

## 2015-06-04 DIAGNOSIS — E559 Vitamin D deficiency, unspecified: Secondary | ICD-10-CM | POA: Diagnosis not present

## 2015-06-04 DIAGNOSIS — Z87448 Personal history of other diseases of urinary system: Secondary | ICD-10-CM

## 2015-06-04 DIAGNOSIS — F17200 Nicotine dependence, unspecified, uncomplicated: Secondary | ICD-10-CM

## 2015-06-04 DIAGNOSIS — E038 Other specified hypothyroidism: Secondary | ICD-10-CM

## 2015-06-04 LAB — URINALYSIS, ROUTINE W REFLEX MICROSCOPIC
Bilirubin Urine: NEGATIVE
Glucose, UA: NEGATIVE
Ketones, ur: NEGATIVE
Leukocytes, UA: NEGATIVE
Nitrite: NEGATIVE
Protein, ur: NEGATIVE
Specific Gravity, Urine: <1.005 (ref 1.001–1.035)
pH: 6 (ref 5.0–8.0)

## 2015-06-04 LAB — COMPREHENSIVE METABOLIC PANEL WITH GFR
ALT: 16 U/L (ref 6–29)
AST: 21 U/L (ref 10–35)
Albumin: 3.9 g/dL (ref 3.6–5.1)
Alkaline Phosphatase: 49 U/L (ref 33–130)
BUN: 16 mg/dL (ref 7–25)
CO2: 24 mmol/L (ref 20–31)
Calcium: 9.2 mg/dL (ref 8.6–10.4)
Chloride: 106 mmol/L (ref 98–110)
Creat: 0.95 mg/dL (ref 0.50–0.99)
Glucose, Bld: 91 mg/dL (ref 70–99)
Potassium: 4.3 mmol/L (ref 3.5–5.3)
Sodium: 140 mmol/L (ref 135–146)
Total Bilirubin: 0.5 mg/dL (ref 0.2–1.2)
Total Protein: 6.4 g/dL (ref 6.1–8.1)

## 2015-06-04 LAB — URINALYSIS, MICROSCOPIC ONLY
Casts: NONE SEEN [LPF]
Crystals: NONE SEEN [HPF]
Yeast: NONE SEEN [HPF]

## 2015-06-04 LAB — TSH: TSH: 2.262 u[IU]/mL (ref 0.350–4.500)

## 2015-06-04 LAB — CBC WITH DIFFERENTIAL/PLATELET
Basophils Absolute: 0.1 10*3/uL (ref 0.0–0.1)
Basophils Relative: 1 % (ref 0–1)
Eosinophils Absolute: 0.2 10*3/uL (ref 0.0–0.7)
Eosinophils Relative: 2 % (ref 0–5)
HCT: 38.5 % (ref 36.0–46.0)
Hemoglobin: 12.8 g/dL (ref 12.0–15.0)
Lymphocytes Relative: 23 % (ref 12–46)
Lymphs Abs: 1.8 10*3/uL (ref 0.7–4.0)
MCH: 33.8 pg (ref 26.0–34.0)
MCHC: 33.2 g/dL (ref 30.0–36.0)
MCV: 101.6 fL — ABNORMAL HIGH (ref 78.0–100.0)
MPV: 10.9 fL (ref 8.6–12.4)
Monocytes Absolute: 0.6 10*3/uL (ref 0.1–1.0)
Monocytes Relative: 7 % (ref 3–12)
Neutro Abs: 5.4 10*3/uL (ref 1.7–7.7)
Neutrophils Relative %: 67 % (ref 43–77)
Platelets: 257 10*3/uL (ref 150–400)
RBC: 3.79 MIL/uL — ABNORMAL LOW (ref 3.87–5.11)
RDW: 13.4 % (ref 11.5–15.5)
WBC: 8 10*3/uL (ref 4.0–10.5)

## 2015-06-04 LAB — LIPID PANEL
Cholesterol: 201 mg/dL — ABNORMAL HIGH (ref 125–200)
HDL: 67 mg/dL
LDL Cholesterol: 120 mg/dL
Total CHOL/HDL Ratio: 3 ratio
Triglycerides: 69 mg/dL
VLDL: 14 mg/dL

## 2015-06-04 LAB — T4, FREE: FREE T4: 1.22 ng/dL (ref 0.80–1.80)

## 2015-06-04 LAB — T3, FREE: T3 FREE: 2.4 pg/mL (ref 2.3–4.2)

## 2015-06-04 NOTE — Assessment & Plan Note (Signed)
Check TFT  

## 2015-06-04 NOTE — Assessment & Plan Note (Signed)
Counseled on smoking station. With her recurrent episodes of wheezing I'm going to get a pulmonary function test. Concerned that she has COPD

## 2015-06-04 NOTE — Progress Notes (Signed)
Patient ID: Wendy Mata, female   DOB: 1951-05-30, 64 y.o.   MRN: [id number]   Subjective:    Patient ID: Wendy Mata, female    DOB: Jul 21, 1950, 64 y.o.   MRN: [id number]  Patient presents for CPE and Repeat UA  Pt here for CPE, due for Mammogram, over due for colon cancer screening  PAP Due in Oct 2017 Due for flu shot, TDAP- new grandchild  Due for hepatitis C screening based on age Medications reviewed History of microscopic hematuria needs UA today   Seen by Eye doctor- has floaters, no restrictions with driving  Review Of Systems:  GEN- denies fatigue, fever, weight loss,weakness, recent illness HEENT- denies eye drainage, change in vision, nasal discharge, CVS- denies chest pain, palpitations RESP- denies SOB, cough, wheeze ABD- denies N/V, change in stools, abd pain GU- denies dysuria, hematuria, dribbling, incontinence MSK- denies joint pain, muscle aches, injury Neuro- denies headache, dizziness, syncope, seizure activity       Objective:    BP 132/78 mmHg  Pulse 68  Temp(Src) 98.3 F (36.8 C) (Oral)  Resp 16  Ht 5\' 3"  (1.6 m)  Wt 162 lb (73.483 kg)  BMI 28.70 kg/m2 GEN- NAD, alert and oriented x3 HEENT- PERRL, EOMI, non injected sclera, pink conjunctiva, MMM, oropharynx clear Neck- Supple, no thyromegaly CVS- RRR, no murmur RESP-occasional inspiratory wheeze, no rales  ABD-NABS,soft,NT,ND EXT- No edema Pulses- Radial, DP- 2+        Assessment & Plan:      Problem List Items Addressed This Visit    Vitamin D deficiency   Relevant Orders   VITAMIN D 25 Hydroxy (Vit-D Deficiency, Fractures)   Tobacco use disorder    Counseled on smoking station. With her recurrent episodes of wheezing I'm going to get a pulmonary function test. Concerned that she has COPD      Relevant Orders   Pulmonary function test   Screening cholesterol level   Relevant Orders   Lipid panel   Hypothyroidism    Check TFT      Relevant Orders   TSH   T3, free   T4, free   Anxiety state    Doing well with Klonopin as needed       Other Visit Diagnoses    Routine general medical examination at a health care facility    -  Primary    CPE done, return stool cards for colon cancer screening, fasting labs, TDAP given, MAMMOGRAM/pap due in 2017    Relevant Orders    CBC with Differential/Platelet    Comprehensive metabolic panel    Lipid panel    Hx of hematuria        Relevant Orders    Urinalysis, Routine w reflex microscopic (not at [facility]) (Completed)    Need for hepatitis C screening test        Relevant Orders    Hepatitis C antibody, reflex    Breast cancer screening        Relevant Orders    MM DIGITAL SCREENING BILATERAL    Wheezing        Relevant Orders    Pulmonary function test       Note: This dictation was prepared with Dragon dictation along with smaller phrase technology. Any transcriptional errors that result from this process are unintentional.

## 2015-06-04 NOTE — Patient Instructions (Addendum)
Continue current medications We will call with labs TDAP given  Pulmonary Function F/U 1 year for physical

## 2015-06-04 NOTE — Assessment & Plan Note (Signed)
Doing well with Klonopin as needed

## 2015-06-04 NOTE — Addendum Note (Signed)
Addended by: Sheral Flow on: 06/04/2015 10:04 AM   Modules accepted: Orders

## 2015-06-05 LAB — HEPATITIS C ANTIBODY: HCV AB: NEGATIVE

## 2015-06-05 LAB — VITAMIN D 25 HYDROXY (VIT D DEFICIENCY, FRACTURES): VIT D 25 HYDROXY: 49 ng/mL (ref 30–100)

## 2015-06-06 ENCOUNTER — Encounter: Payer: Self-pay | Admitting: Family Medicine

## 2015-06-10 ENCOUNTER — Encounter: Payer: Self-pay | Admitting: *Deleted

## 2015-06-11 ENCOUNTER — Encounter: Payer: Self-pay | Admitting: *Deleted

## 2015-06-18 ENCOUNTER — Telehealth: Payer: Self-pay | Admitting: Family Medicine

## 2015-06-18 NOTE — Telephone Encounter (Signed)
Call placed to patient. LMTRC.  

## 2015-06-18 NOTE — Telephone Encounter (Signed)
Call pt , I have the Urology records- where she has had chronic blood, the scope was negative but she had some spots on her kidneys that they recommended she have another MRI in 1 year, which she is now 2 years overdue. This seems to be an incidental finding but they were concerned enough because of the blood in her urine to make sure this was not some form of slow growing kidney cancer.   I recommend she at least have the Renal MRI- Dx- kidney lesions, hematuria

## 2015-06-19 NOTE — Telephone Encounter (Signed)
Pt aware of MRI results and pt stated that she wants to hold off on the MRI for right now and will call when she is ready to do the scan.

## 2015-06-19 NOTE — Telephone Encounter (Signed)
noted 

## 2015-07-15 ENCOUNTER — Other Ambulatory Visit: Payer: Self-pay | Admitting: Family Medicine

## 2015-07-15 NOTE — Telephone Encounter (Signed)
Refill appropriate and filled per protocol. 

## 2015-09-26 ENCOUNTER — Other Ambulatory Visit: Payer: Self-pay | Admitting: Family Medicine

## 2015-09-27 NOTE — Telephone Encounter (Signed)
Refill appropriate and filled per protocol. 

## 2015-10-07 ENCOUNTER — Telehealth: Payer: Self-pay | Admitting: *Deleted

## 2015-10-07 MED ORDER — CLONAZEPAM 0.5 MG PO TABS: ORAL_TABLET | ORAL | Status: DC

## 2015-10-07 NOTE — Telephone Encounter (Signed)
Okay to refill? 

## 2015-10-07 NOTE — Telephone Encounter (Signed)
Received fax requesting refill on clonazepam.   Ok to refill??  Last office visit 06/04/2015.  Last refill 05/31/2015, #3 refills.

## 2015-10-07 NOTE — Telephone Encounter (Signed)
Medication called to pharmacy. 

## 2016-01-31 ENCOUNTER — Other Ambulatory Visit: Payer: Self-pay | Admitting: Family Medicine

## 2016-01-31 NOTE — Telephone Encounter (Signed)
Okay to refill? 

## 2016-01-31 NOTE — Telephone Encounter (Signed)
Ok to refill 

## 2016-02-05 ENCOUNTER — Encounter: Payer: Self-pay | Admitting: Family Medicine

## 2016-02-05 MED ORDER — OMEPRAZOLE 20 MG PO CPDR: 20.0000 mg | DELAYED_RELEASE_CAPSULE | Freq: Every day | ORAL | 1 refills | Status: DC

## 2016-02-05 MED ORDER — SYNTHROID 100 MCG PO TABS: 100.0000 ug | ORAL_TABLET | Freq: Every day | ORAL | 1 refills | Status: DC

## 2016-02-27 ENCOUNTER — Ambulatory Visit (INDEPENDENT_AMBULATORY_CARE_PROVIDER_SITE_OTHER): Payer: Commercial Managed Care - PPO | Admitting: Physician Assistant

## 2016-02-27 ENCOUNTER — Encounter: Payer: Self-pay | Admitting: Physician Assistant

## 2016-02-27 VITALS — BP 132/80 | HR 69 | Temp 98.2°F | Resp 16 | Wt 159.0 lb

## 2016-02-27 DIAGNOSIS — B372 Candidiasis of skin and nail: Secondary | ICD-10-CM

## 2016-02-27 MED ORDER — NYSTATIN 100000 UNIT/GM EX CREA: 1.0000 "application " | TOPICAL_CREAM | Freq: Two times a day (BID) | CUTANEOUS | 0 refills | Status: DC

## 2016-02-27 NOTE — Progress Notes (Signed)
Patient ID: MAKO KHERA MRN: [id number], DOB: September 01, 1950, 65 y.o. Date of Encounter: 02/27/2016, 8:47 AM    Chief Complaint:  Chief Complaint  Patient presents with  . boil    white puss drainage, soaked in tub of clorox causing the boil to burn in groin area on right side, started three weeks ago     HPI: 65 y.o. year old female is that she had some irritated hair follicles in her pubic region. She looked on the Internet for treatment options  and saw that you could mix Clorox with water and sit in that tub of Clorox water for 10 minutes --says she did this. Now she shows me this area that is red in her right groin crease (she thinks this is secondary to clorox burn) .     Home Meds:   Outpatient Medications Prior to Visit  Medication Sig Dispense Refill  . albuterol (PROVENTIL HFA;VENTOLIN HFA) 108 (90 BASE) MCG/ACT inhaler Inhale 2 puffs into the lungs every 4 (four) hours as needed for wheezing or shortness of breath. 1 Inhaler 2  . aspirin EC 81 MG tablet Take 81 mg by mouth daily.    . Cholecalciferol (VITAMIN D) 1000 UNITS capsule Take 2,000 Units by mouth daily.     . clonazePAM (KLONOPIN) 0.5 MG tablet take 1 tablet by mouth three times a day if needed for anxiety or AGITATION. REFILLS MUST BE 30 DAYS APART 90 tablet 3  . norethindrone-ethinyl estradiol (FEMHRT 1/5) 1-5 MG-MCG TABS Take 1 tablet by mouth daily. 28 tablet 3  . omeprazole (PRILOSEC) 20 MG capsule Take 1 capsule (20 mg total) by mouth daily. 90 capsule 1  . SYNTHROID 100 MCG tablet Take 1 tablet (100 mcg total) by mouth daily before breakfast. 90 tablet 1  . guaiFENesin-codeine 100-10 MG/5ML syrup Take 5 mLs by mouth every 6 (six) hours as needed for cough. 180 mL 0   No facility-administered medications prior to visit.     Allergies:  Allergies  Allergen Reactions  . Levaquin [Levofloxacin In D5w]   . Penicillins       Review of Systems: See HPI for pertinent ROS. All other ROS negative.     Physical Exam: Blood pressure 132/80, pulse 69, temperature 98.2 F (36.8 C), temperature source Oral, resp. rate 16, weight 159 lb (72.1 kg), SpO2 98 %., Body mass index is 28.17 kg/m. General:  WNWD WF. Appears in no acute distress. Neck: Supple. No thyromegaly. No lymphadenopathy. Lungs: Clear bilaterally to auscultation without wheezes, rales, or rhonchi. Breathing is unlabored. Heart: Regular rhythm. No murmurs, rubs, or gallops. Msk:  Strength and tone normal for age. Skin: Right Groin Crease: Well demarcated area of diffuse erythema in crease of right groin with satellite lesions (erythematous papules scattered around periphery) Neuro: Alert and oriented X 3. Moves all extremities spontaneously. Gait is normal. CNII-XII grossly in tact. Psych:  Responds to questions appropriately with a normal affect.     ASSESSMENT AND PLAN:  65 y.o. year old female with  1. Candidiasis of skin Discussed with her making sure that the skin is completely dry. After she showers she needs to dry the skin with towel as well as possible and then either air dry or even dry it with a hair dryer further. Apply nystatin cream. When she makes further applications during the day she just needs to make sure that the skin is completely dry prior to applying the cream. To prevent reoccurrence make sure that she  keeps this area dry and avoid moisture. - nystatin cream (MYCOSTATIN); Apply 1 application topically 2 (two) times daily.  Dispense: 30 g; Refill: 0   Signed, [address] [place], [place], [facility] 02/27/2016 8:47 AM

## 2016-03-04 ENCOUNTER — Other Ambulatory Visit: Payer: Self-pay | Admitting: Family Medicine

## 2016-06-01 ENCOUNTER — Encounter: Payer: Self-pay | Admitting: Family Medicine

## 2016-06-02 ENCOUNTER — Other Ambulatory Visit: Payer: Self-pay | Admitting: Family Medicine

## 2016-06-02 NOTE — Telephone Encounter (Signed)
Ok to refill??  Last office visit 02/27/2016.  Last refill 02/03/2016, #3 refills.

## 2016-06-02 NOTE — Telephone Encounter (Signed)
okay

## 2016-06-05 ENCOUNTER — Encounter: Payer: Commercial Managed Care - PPO | Admitting: Family Medicine

## 2016-06-15 ENCOUNTER — Encounter: Payer: Self-pay | Admitting: Family Medicine

## 2016-06-15 MED ORDER — SULFAMETHOXAZOLE-TRIMETHOPRIM 800-160 MG PO TABS: 1.0000 | ORAL_TABLET | Freq: Two times a day (BID) | ORAL | 0 refills | Status: DC

## 2016-07-10 ENCOUNTER — Ambulatory Visit (INDEPENDENT_AMBULATORY_CARE_PROVIDER_SITE_OTHER): Payer: Commercial Managed Care - PPO | Admitting: Family Medicine

## 2016-07-10 ENCOUNTER — Telehealth: Payer: Self-pay | Admitting: *Deleted

## 2016-07-10 ENCOUNTER — Encounter: Payer: Self-pay | Admitting: Family Medicine

## 2016-07-10 VITALS — BP 128/74 | HR 62 | Temp 98.3°F | Resp 16 | Ht 63.0 in | Wt 164.0 lb

## 2016-07-10 DIAGNOSIS — E559 Vitamin D deficiency, unspecified: Secondary | ICD-10-CM | POA: Diagnosis not present

## 2016-07-10 DIAGNOSIS — F17200 Nicotine dependence, unspecified, uncomplicated: Secondary | ICD-10-CM

## 2016-07-10 DIAGNOSIS — Z13220 Encounter for screening for lipoid disorders: Secondary | ICD-10-CM | POA: Diagnosis not present

## 2016-07-10 DIAGNOSIS — E038 Other specified hypothyroidism: Secondary | ICD-10-CM

## 2016-07-10 DIAGNOSIS — Z0000 Encounter for general adult medical examination without abnormal findings: Secondary | ICD-10-CM | POA: Diagnosis not present

## 2016-07-10 DIAGNOSIS — Z124 Encounter for screening for malignant neoplasm of cervix: Secondary | ICD-10-CM

## 2016-07-10 DIAGNOSIS — H60391 Other infective otitis externa, right ear: Secondary | ICD-10-CM | POA: Diagnosis not present

## 2016-07-10 LAB — T4, FREE: Free T4: 1.2 ng/dL (ref 0.8–1.8)

## 2016-07-10 LAB — CBC WITH DIFFERENTIAL/PLATELET
Basophils Absolute: 77 cells/uL (ref 0–200)
Basophils Relative: 1 %
Eosinophils Absolute: 231 cells/uL (ref 15–500)
Eosinophils Relative: 3 %
HCT: 39.8 % (ref 35.0–45.0)
Hemoglobin: 13.1 g/dL (ref 12.0–15.0)
Lymphocytes Relative: 29 %
Lymphs Abs: 2233 cells/uL (ref 850–3900)
MCH: 34 pg — ABNORMAL HIGH (ref 27.0–33.0)
MCHC: 32.9 g/dL (ref 32.0–36.0)
MCV: 103.4 fL — ABNORMAL HIGH (ref 80.0–100.0)
MPV: 10.1 fL (ref 7.5–12.5)
Monocytes Absolute: 616 cells/uL (ref 200–950)
Monocytes Relative: 8 %
Neutro Abs: 4543 cells/uL (ref 1500–7800)
Neutrophils Relative %: 59 %
Platelets: 281 10*3/uL (ref 140–400)
RBC: 3.85 MIL/uL (ref 3.80–5.10)
RDW: 13.3 % (ref 11.0–15.0)
WBC: 7.7 10*3/uL (ref 3.8–10.8)

## 2016-07-10 LAB — COMPREHENSIVE METABOLIC PANEL
ALT: 14 U/L (ref 6–29)
AST: 20 U/L (ref 10–35)
Albumin: 4 g/dL (ref 3.6–5.1)
Alkaline Phosphatase: 52 U/L (ref 33–130)
BUN: 21 mg/dL (ref 7–25)
CO2: 27 mmol/L (ref 20–31)
Calcium: 9.5 mg/dL (ref 8.6–10.4)
Chloride: 103 mmol/L (ref 98–110)
Creat: 1.09 mg/dL — ABNORMAL HIGH (ref 0.50–0.99)
Glucose, Bld: 85 mg/dL (ref 70–99)
Potassium: 4 mmol/L (ref 3.5–5.3)
Sodium: 138 mmol/L (ref 135–146)
Total Bilirubin: 0.4 mg/dL (ref 0.2–1.2)
Total Protein: 6.7 g/dL (ref 6.1–8.1)

## 2016-07-10 LAB — TSH: TSH: 4.84 mIU/L — ABNORMAL HIGH

## 2016-07-10 LAB — T3, FREE: T3, Free: 2.7 pg/mL (ref 2.3–4.2)

## 2016-07-10 LAB — LIPID PANEL
Cholesterol: 211 mg/dL — ABNORMAL HIGH (ref <200)
HDL: 71 mg/dL (ref >50)
LDL CALC: 124 mg/dL — AB (ref <100)
Total CHOL/HDL Ratio: 3 Ratio (ref <5.0)
Triglycerides: 80 mg/dL (ref <150)
VLDL: 16 mg/dL (ref <30)

## 2016-07-10 MED ORDER — NEOMYCIN-POLYMYXIN-HC 3.5-10000-1 OT SOLN: 3.0000 [drp] | Freq: Four times a day (QID) | OTIC | 0 refills | Status: DC

## 2016-07-10 MED ORDER — OMEPRAZOLE 20 MG PO CPDR: 20.0000 mg | DELAYED_RELEASE_CAPSULE | Freq: Every day | ORAL | 1 refills | Status: DC

## 2016-07-10 NOTE — Assessment & Plan Note (Signed)
counsled on cessation not ready to quit

## 2016-07-10 NOTE — Telephone Encounter (Signed)
Received NO for Cologuard.   Order [id number] placed.

## 2016-07-10 NOTE — Progress Notes (Signed)
   Subjective:    Patient ID: Wendy Mata, female    DOB: 1951-01-31, 66 y.o.   MRN: [id number]  Patient presents for CPE with PAP (is fasting) Patient here for complete physical exam. Her only concern is she's had some right ear pain and feels that she cannot hear out of it. She remembers getting some water into it after shower she did notice some wax they came out but otherwise has not had any other drainage. She had some mild nasal congestion have blood-tinged sputum in the nares one time otherwise feels fine. This actually occurred after Christmas.   Immunizations- UTD  PAP Smear- due today  Coloonoscpopy- Due  Mammogram- due has not scheduled  Due for fasting labs T uses smoke not ready to quit  Review Of Systems:  GEN- denies fatigue, fever, weight loss,weakness, recent illness HEENT- denies eye drainage, change in vision, nasal discharge, CVS- denies chest pain, palpitations RESP- denies SOB, cough, wheeze ABD- denies N/V, change in stools, abd pain GU- denies dysuria, hematuria, dribbling, incontinence MSK- denies joint pain, muscle aches, injury Neuro- denies headache, dizziness, syncope, seizure activity       Objective:    BP 128/74 (BP Location: Left Arm, Patient Position: Sitting, Cuff Size: Normal)   Pulse 62   Temp 98.3 F (36.8 C) (Oral)   Resp 16   Ht 5\' 3"  (1.6 m)   Wt 164 lb (74.4 kg)   SpO2 98%   BMI 29.05 kg/m  GEN- NAD, alert and oriented x3 HEENT- PERRL, EOMI, non injected sclera, pink conjunctiva, MMM, oropharynx clear,Left canal mild wax, Right Canal erythema, swelling, yellow discharge, TM not seen, nares clear Neck- Supple, no thyromegaly, no LAD  Breast- normal symmetry, no nipple inversion,no nipple drainage, no nodules or lumps felt Nodes- no axillary nodes CVS- RRR, no murmur RESP-CTAB ABD-NABS,soft,NT,ND GU- normal external genitalia, vaginal mucosa pink and moist, cervix visualized no growth, no blood form os,no  discharge, no CMT, no  ovarian masses, uterus normal size Skin- inner thighs, scarring from previous boils  EXT- No edema Pulses- Radial, DP- 2+        Assessment & Plan:      Problem List Items Addressed This Visit    Vitamin D deficiency    Check vitamin D level, continue supplement      Tobacco use disorder    counsled on cessation not ready to quit      Screening cholesterol level   Relevant Orders   Lipid panel   Hypothyroidism    Check TFT      Relevant Orders   TSH   T3, free   T4, free    Other Visit Diagnoses    Routine general medical examination at a health care facility    -  Primary   CPE done, PAP Smear if normal, none further needed. Pt to schedule Mammogram. Agrees to Cologuard testing.   Relevant Orders   Comprehensive metabolic panel   CBC with Differential/Platelet   Cervical cancer screening       Relevant Orders   PAP, ThinPrep ASCUS Rflx HPV Rflx Type   Other infective acute otitis externa of right ear       Cortisporin drops given, may need oral if not improved       Note: This dictation was prepared with Dragon dictation along with smaller phrase technology. Any transcriptional errors that result from this process are unintentional.

## 2016-07-10 NOTE — Assessment & Plan Note (Signed)
Check TFT  

## 2016-07-10 NOTE — Assessment & Plan Note (Signed)
Check vitamin D level, continue supplement

## 2016-07-10 NOTE — Patient Instructions (Addendum)
Schedule your mammogram Cologuard testing for colon cancer  I recommend eye visit once a year I recommend dental visit every 6 months Goal is to  Exercise 30 minutes 5 days a week We will send a letter with lab results  I recommend you quit smoking Use the ear drops, call if not improved F/U 1 year for Physical

## 2016-07-14 ENCOUNTER — Other Ambulatory Visit: Payer: Self-pay | Admitting: *Deleted

## 2016-07-14 ENCOUNTER — Encounter: Payer: Self-pay | Admitting: Family Medicine

## 2016-07-14 DIAGNOSIS — E039 Hypothyroidism, unspecified: Secondary | ICD-10-CM

## 2016-07-14 LAB — PAP THINPREP ASCUS RFLX HPV RFLX TYPE

## 2016-07-23 ENCOUNTER — Encounter: Payer: Self-pay | Admitting: Family Medicine

## 2016-07-23 DIAGNOSIS — H60501 Unspecified acute noninfective otitis externa, right ear: Secondary | ICD-10-CM

## 2016-07-27 ENCOUNTER — Encounter: Payer: Self-pay | Admitting: Family Medicine

## 2016-07-27 NOTE — Telephone Encounter (Signed)
Referral placed and sent to [facility]

## 2016-08-12 ENCOUNTER — Encounter: Payer: Self-pay | Admitting: Family Medicine

## 2016-08-13 ENCOUNTER — Other Ambulatory Visit: Payer: Self-pay

## 2016-08-13 ENCOUNTER — Ambulatory Visit (INDEPENDENT_AMBULATORY_CARE_PROVIDER_SITE_OTHER): Payer: Commercial Managed Care - PPO | Admitting: Physician Assistant

## 2016-08-13 ENCOUNTER — Encounter: Payer: Self-pay | Admitting: Physician Assistant

## 2016-08-13 VITALS — BP 130/86 | HR 75 | Temp 97.8°F | Resp 16 | Wt 162.2 lb

## 2016-08-13 DIAGNOSIS — J988 Other specified respiratory disorders: Secondary | ICD-10-CM

## 2016-08-13 DIAGNOSIS — E039 Hypothyroidism, unspecified: Secondary | ICD-10-CM | POA: Diagnosis not present

## 2016-08-13 DIAGNOSIS — J0100 Acute maxillary sinusitis, unspecified: Secondary | ICD-10-CM

## 2016-08-13 DIAGNOSIS — B9689 Other specified bacterial agents as the cause of diseases classified elsewhere: Secondary | ICD-10-CM

## 2016-08-13 LAB — T3, FREE: T3, Free: 2.9 pg/mL (ref 2.3–4.2)

## 2016-08-13 LAB — T4, FREE: FREE T4: 1.2 ng/dL (ref 0.8–1.8)

## 2016-08-13 LAB — TSH: TSH: 1.78 mIU/L

## 2016-08-13 MED ORDER — AZITHROMYCIN 250 MG PO TABS: ORAL_TABLET | ORAL | 0 refills | Status: DC

## 2016-08-13 MED ORDER — PREDNISONE 20 MG PO TABS: ORAL_TABLET | ORAL | 0 refills | Status: DC

## 2016-08-13 NOTE — Progress Notes (Signed)
Patient ID: NICHOLETTE TERRELL MRN: [id number], DOB: 1951-03-23, 66 y.o. Date of Encounter: 08/13/2016, 8:12 AM    Chief Complaint:  Chief Complaint  Patient presents with  . Nasal Congestion    xdays     HPI: 66 y.o. year old female states that she has been having nasal congestion and nasal mucus for 7 days. All in her head and nose. None in her chest. Getting out thick dark mucus and is having pressure and points to her maxillary sinus region. Says that she did have some fever yesterday. Has taken some ibuprofen this morning so reading normal temperature here. Has had no sore throat. No chest congestion.     Home Meds:   Outpatient Medications Prior to Visit  Medication Sig Dispense Refill  . albuterol (PROVENTIL HFA;VENTOLIN HFA) 108 (90 BASE) MCG/ACT inhaler Inhale 2 puffs into the lungs every 4 (four) hours as needed for wheezing or shortness of breath. 1 Inhaler 2  . aspirin EC 81 MG tablet Take 81 mg by mouth daily.    . Cholecalciferol (VITAMIN D) 1000 UNITS capsule Take 2,000 Units by mouth daily.     . clonazePAM (KLONOPIN) 0.5 MG tablet take 1 tablet by mouth three times a day if needed for anxiety or AGITATION. (MUST LAST 30 DASY) 90 tablet 3  . FYAVOLV 1-5 MG-MCG TABS tablet take 1 tablet by mouth once daily 28 tablet 3  . omeprazole (PRILOSEC) 20 MG capsule Take 1 capsule (20 mg total) by mouth daily. 90 capsule 1  . SYNTHROID 100 MCG tablet Take 1 tablet (100 mcg total) by mouth daily before breakfast. 90 tablet 1  . neomycin-polymyxin-hydrocortisone (CORTISPORIN) otic solution Place 3 drops into the right ear 4 (four) times daily. For 7 days (Patient not taking: Reported on 08/13/2016) 10 mL 0  . nystatin cream (MYCOSTATIN) Apply 1 application topically 2 (two) times daily. (Patient not taking: Reported on 08/13/2016) 30 g 0   No facility-administered medications prior to visit.     Allergies:  Allergies  Allergen Reactions  . Levaquin [Levofloxacin In D5w]   .  Penicillins       Review of Systems: See HPI for pertinent ROS. All other ROS negative.    Physical Exam: Blood pressure 130/86, pulse 75, temperature 97.8 F (36.6 C), temperature source Oral, resp. rate 16, weight 162 lb 3.2 oz (73.6 kg), SpO2 98 %., Body mass index is 28.73 kg/m. General:  WNWD WF. Appears in no acute distress. HEENT: Normocephalic, atraumatic, eyes without discharge, sclera non-icteric, nares are without discharge. Bilateral auditory canals clear, TM's are without perforation, pearly grey and translucent with reflective cone of light bilaterally. Oral cavity moist, posterior pharynx without exudate, erythema, peritonsillar abscess. Tenderness with percussion to bilateral maxillary sinuses. No tenderness with percussion to frontal sinuses.  Neck: Supple. No thyromegaly. No lymphadenopathy. Lungs: Clear bilaterally to auscultation without wheezes, rales, or rhonchi. Breathing is unlabored. Heart: Regular rhythm. No murmurs, rubs, or gallops. Msk:  Strength and tone normal for age. Extremities/Skin: Warm and dry. Neuro: Alert and oriented X 3. Moves all extremities spontaneously. Gait is normal. CNII-XII grossly in tact. Psych:  Responds to questions appropriately with a normal affect.     ASSESSMENT AND PLAN:  66 y.o. year old female with  1. Acute maxillary sinusitis, recurrence not specified Has allergy to penicillin so will avoid Augmentin. Also allergy to Levaquin. Therefore will use Azithromycin. She is to take the azithromycin as directed and also the prednisone taper as  directed. Follow-up if symptoms do not resolve after this. Note given for out of work today and tomorrow. Return Monday. - azithromycin (ZITHROMAX) 250 MG tablet; Day 1: Take 2 daily. Days 2 - 5: Take 1 daily.  Dispense: 6 tablet; Refill: 0 - predniSONE (DELTASONE) 20 MG tablet; Take 3 daily for 2 days, then 2 daily for 2 days, then 1 daily for 2 days.  Dispense: 12 tablet; Refill: 0  2.  Bacterial respiratory infection  - azithromycin (ZITHROMAX) 250 MG tablet; Day 1: Take 2 daily. Days 2 - 5: Take 1 daily.  Dispense: 6 tablet; Refill: 0   Signed, [address] [place], [place], [facility] 08/13/2016 8:12 AM

## 2016-09-16 ENCOUNTER — Encounter: Payer: Self-pay | Admitting: Family Medicine

## 2016-09-16 MED ORDER — OMEPRAZOLE 20 MG PO CPDR: 20.0000 mg | DELAYED_RELEASE_CAPSULE | Freq: Every day | ORAL | 3 refills | Status: DC

## 2016-09-17 ENCOUNTER — Other Ambulatory Visit: Payer: Self-pay | Admitting: Family Medicine

## 2016-10-07 ENCOUNTER — Other Ambulatory Visit: Payer: Self-pay | Admitting: Family Medicine

## 2016-10-07 NOTE — Telephone Encounter (Signed)
Okay to refill? 

## 2016-10-07 NOTE — Telephone Encounter (Signed)
Ok to refill??  Last office visit 08/13/2016.  Last refill 06/03/2016, #3 refills.

## 2016-10-07 NOTE — Telephone Encounter (Signed)
Medication called to pharmacy. 

## 2016-12-07 ENCOUNTER — Encounter: Payer: Self-pay | Admitting: Family Medicine

## 2016-12-09 ENCOUNTER — Encounter: Payer: Self-pay | Admitting: Family Medicine

## 2016-12-09 ENCOUNTER — Ambulatory Visit (INDEPENDENT_AMBULATORY_CARE_PROVIDER_SITE_OTHER): Payer: Commercial Managed Care - PPO | Admitting: Family Medicine

## 2016-12-09 VITALS — BP 128/78 | HR 82 | Temp 98.8°F | Resp 14 | Ht 63.0 in | Wt 160.0 lb

## 2016-12-09 DIAGNOSIS — H6061 Unspecified chronic otitis externa, right ear: Secondary | ICD-10-CM

## 2016-12-09 DIAGNOSIS — H9191 Unspecified hearing loss, right ear: Secondary | ICD-10-CM

## 2016-12-09 MED ORDER — CLONAZEPAM 0.5 MG PO TABS: ORAL_TABLET | ORAL | 3 refills | Status: DC

## 2016-12-09 NOTE — Progress Notes (Signed)
   Subjective:    Patient ID: Wendy Mata, female    DOB: 1951-04-29, 66 y.o.   MRN: [id number]  Patient presents for R Ear Pressure (x2 months- states that she has something that moves in ear, but if she holds her breath, she can hear) Patient here with recurrent ear pain. She was treated for otitis externa back in January with Polytrim drops. She is still having difficulty with hearing out of her right ear therefore she was referred to ear nose and throat about 2 weeks after her visit but she canceled the appointment. For past 2 months, feels something moving in her ear and feels she can't hear out of it unless she holds her breath pop.    Taking zyrtec for her allergies   Review Of Systems: per above   GEN- denies fatigue, fever, weight loss,weakness, recent illness HEENT- denies eye drainage, change in vision, nasal discharge, CVS- denies chest pain, palpitations RESP- denies SOB, cough, wheeze ABD- denies N/V, change in stools, abd pain GU- denies dysuria, hematuria, dribbling, incontinence MSK- denies joint pain, muscle aches, injury Neuro- denies headache, dizziness, syncope, seizure activity       Objective:    BP 128/78   Pulse 82   Temp 98.8 F (37.1 C) (Oral)   Resp 14   Ht 5\' 3"  (1.6 m)   Wt 160 lb (72.6 kg)   SpO2 98%   BMI 28.34 kg/m  GEN- NAD, alert and oriented x3 HEENT- PERRL, EOMI, non injected sclera, pink conjunctiva, MMM, oropharynx clear, Left TM clear no effusion  No maxillary sinus tenderness,nares clear, Right TM with swelling, erythema of canal, white discharge obscurring TM,hearing in tact grossly   Neck- Supple, no LAD CVS- RRR, no murmur RESP-CTAB EXT- No edema Pulses- Radial 2+         Assessment & Plan:      Problem List Items Addressed This Visit    None    Visit Diagnoses    Chronic otitis externa of right ear, unspecified type    -  Primary   Chronic inflammation and swelling, urgent appt with ENT in the morning, I think she  will need suctioning, see if this may be fungal mixed with bacterial    Relevant Orders   Ambulatory referral to ENT   Hearing loss of right ear, unspecified hearing loss type       Relevant Orders   Ambulatory referral to ENT      Note: This dictation was prepared with Dragon dictation along with smaller phrase technology. Any transcriptional errors that result from this process are unintentional.

## 2016-12-09 NOTE — Patient Instructions (Addendum)
Dr. Marcelline Mates ENT  [phone]  10:30am  F/U as previous

## 2017-02-08 ENCOUNTER — Encounter: Payer: Self-pay | Admitting: Family Medicine

## 2017-02-08 ENCOUNTER — Other Ambulatory Visit: Payer: Self-pay | Admitting: Family Medicine

## 2017-02-08 NOTE — Telephone Encounter (Signed)
Medication called to pharmacy. 

## 2017-02-08 NOTE — Telephone Encounter (Signed)
Okay to refill? 

## 2017-02-08 NOTE — Telephone Encounter (Signed)
Ok to refill??  Last office visit/ refill 12/09/2016, #3 refills.

## 2017-03-05 ENCOUNTER — Other Ambulatory Visit: Payer: Self-pay | Admitting: Family Medicine

## 2017-03-15 LAB — HM MAMMOGRAPHY

## 2017-03-16 ENCOUNTER — Encounter: Payer: Self-pay | Admitting: Family Medicine

## 2017-06-15 ENCOUNTER — Telehealth: Payer: Self-pay | Admitting: *Deleted

## 2017-06-15 NOTE — Telephone Encounter (Signed)
Received fax requesting refill on Klonopin.   Ok to refill??  Last office visit 12/09/2016.  Last refill 02/08/2017, #3 refills.

## 2017-06-15 NOTE — Telephone Encounter (Signed)
Okay 

## 2017-06-17 MED ORDER — CLONAZEPAM 0.5 MG PO TABS: 0.5000 mg | ORAL_TABLET | Freq: Three times a day (TID) | ORAL | 3 refills | Status: DC | PRN

## 2017-06-17 NOTE — Telephone Encounter (Signed)
Medication called to pharmacy. 

## 2017-08-20 ENCOUNTER — Ambulatory Visit (INDEPENDENT_AMBULATORY_CARE_PROVIDER_SITE_OTHER): Payer: Commercial Managed Care - PPO | Admitting: Family Medicine

## 2017-08-20 ENCOUNTER — Other Ambulatory Visit: Payer: Self-pay

## 2017-08-20 ENCOUNTER — Encounter: Payer: Self-pay | Admitting: Family Medicine

## 2017-08-20 ENCOUNTER — Telehealth: Payer: Self-pay | Admitting: *Deleted

## 2017-08-20 VITALS — BP 132/60 | HR 82 | Temp 98.0°F | Resp 16 | Ht 63.0 in | Wt 161.0 lb

## 2017-08-20 DIAGNOSIS — Z0000 Encounter for general adult medical examination without abnormal findings: Secondary | ICD-10-CM

## 2017-08-20 DIAGNOSIS — E038 Other specified hypothyroidism: Secondary | ICD-10-CM

## 2017-08-20 DIAGNOSIS — E559 Vitamin D deficiency, unspecified: Secondary | ICD-10-CM

## 2017-08-20 DIAGNOSIS — F17200 Nicotine dependence, unspecified, uncomplicated: Secondary | ICD-10-CM

## 2017-08-20 DIAGNOSIS — Z1211 Encounter for screening for malignant neoplasm of colon: Secondary | ICD-10-CM

## 2017-08-20 DIAGNOSIS — N951 Menopausal and female climacteric states: Secondary | ICD-10-CM

## 2017-08-20 DIAGNOSIS — K219 Gastro-esophageal reflux disease without esophagitis: Secondary | ICD-10-CM

## 2017-08-20 DIAGNOSIS — Z23 Encounter for immunization: Secondary | ICD-10-CM | POA: Diagnosis not present

## 2017-08-20 DIAGNOSIS — R195 Other fecal abnormalities: Secondary | ICD-10-CM

## 2017-08-20 DIAGNOSIS — F411 Generalized anxiety disorder: Secondary | ICD-10-CM

## 2017-08-20 MED ORDER — OMEPRAZOLE 20 MG PO CPDR: 20.0000 mg | DELAYED_RELEASE_CAPSULE | Freq: Every day | ORAL | 3 refills | Status: DC

## 2017-08-20 NOTE — Assessment & Plan Note (Signed)
counseled on tobacco cessation 

## 2017-08-20 NOTE — Assessment & Plan Note (Addendum)
Prn klonopin

## 2017-08-20 NOTE — Telephone Encounter (Signed)
Received verbal orders for Cologuard.   Order placed via [facility].   Order [id number] has been submitted.

## 2017-08-20 NOTE — Progress Notes (Signed)
   Subjective:    Patient ID: Wendy Mata, female    DOB: 03/14/1951, 67 y.o.   MRN: [id number]  Patient presents for CPE (is fasting) Pt here for CPE Immunizations- Due for Prevnar 13  TDAP/FLU Durenda Guthrie UTD Due for Colonscopy- was not sure if Cologuard was covered therefore did not return  Mammogram UTD  PAP Smear UTD Due for Bone Density   Still working   Treated by company [employer] for food poisoning/ ROM     Continues to smoke 8-10 cif/day    GERD- taking omeprazole daily wants to try to taper off if possible    Hypothyroidism, taking synthroid as prescribed   GAD- takes klonopin as needed   Review Of Systems:  GEN- denies fatigue, fever, weight loss,weakness, recent illness HEENT- denies eye drainage, change in vision, nasal discharge, CVS- denies chest pain, palpitations RESP- denies SOB, cough, wheeze ABD- denies N/V, change in stools, abd pain GU- denies dysuria, hematuria, dribbling, incontinence MSK- denies joint pain, muscle aches, injury Neuro- denies headache, dizziness, syncope, seizure activity       Objective:    BP 132/60   Pulse 82   Temp 98 F (36.7 C) (Oral)   Resp 16   Ht 5\' 3"  (1.6 m)   Wt 161 lb (73 kg)   SpO2 98%   BMI 28.52 kg/m  GEN- NAD, alert and oriented x3 HEENT- PERRL, EOMI, non injected sclera, pink conjunctiva, MMM, oropharynx clear Neck- Supple, no thyromegaly CVS- RRR, no murmur RESP-CTAB ABD-NABS,soft,NT,ND Psych- normal affect and mood EXT- No edema Pulses- Radial, DP- 2+        Assessment & Plan:      Problem List Items Addressed This Visit      Unprioritized   Post menopausal syndrome   Relevant Orders   DG Bone Density   Vitamin D deficiency   Relevant Orders   Vitamin D, 25-hydroxy   Tobacco use disorder    counseled on tobacco cessation       Hypothyroidism    Continue synthroid check TFT      Relevant Orders   TSH   T3, free   T4, free   GERD (gastroesophageal reflux disease)    Will  try to taper off the PPI, can try QOD for now      Anxiety state    Prn klonopin       Other Visit Diagnoses    Routine general medical examination at a health care facility    -  Primary   CPE done, fasting labs today, prevnar 13 given, pt to schedule bone density, cologuard to be resent   Relevant Orders   Comprehensive metabolic panel   CBC with Differential/Platelet   Lipid panel   Pneumococcal conjugate vaccine 13-valent IM   Need for vaccination against Streptococcus pneumoniae using pneumococcal conjugate vaccine 13       Relevant Orders   Pneumococcal conjugate vaccine 13-valent IM      Note: This dictation was prepared with Dragon dictation along with smaller phrase technology. Any transcriptional errors that result from this process are unintentional.

## 2017-08-20 NOTE — Patient Instructions (Addendum)
Cologuard Kit  Schedule your bone density  Prevnar 13 given  We will call with lab results

## 2017-08-20 NOTE — Assessment & Plan Note (Signed)
Will try to taper off the PPI, can try QOD for now

## 2017-08-20 NOTE — Assessment & Plan Note (Signed)
Continue synthroid check TFT

## 2017-08-21 LAB — CBC WITH DIFFERENTIAL/PLATELET
BASOS PCT: 0.8 %
Basophils Absolute: 57 cells/uL (ref 0–200)
EOS ABS: 170 {cells}/uL (ref 15–500)
Eosinophils Relative: 2.4 %
HEMATOCRIT: 36.9 % (ref 35.0–45.0)
Hemoglobin: 12.9 g/dL (ref 11.7–15.5)
LYMPHS ABS: 2258 {cells}/uL (ref 850–3900)
MCH: 34.5 pg — AB (ref 27.0–33.0)
MCHC: 35 g/dL (ref 32.0–36.0)
MCV: 98.7 fL (ref 80.0–100.0)
MPV: 11 fL (ref 7.5–12.5)
Monocytes Relative: 6.6 %
Neutro Abs: 4146 cells/uL (ref 1500–7800)
Neutrophils Relative %: 58.4 %
Platelets: 245 10*3/uL (ref 140–400)
RBC: 3.74 10*6/uL — AB (ref 3.80–5.10)
RDW: 12.6 % (ref 11.0–15.0)
TOTAL LYMPHOCYTE: 31.8 %
WBC: 7.1 10*3/uL (ref 3.8–10.8)
WBCMIX: 469 {cells}/uL (ref 200–950)

## 2017-08-21 LAB — COMPREHENSIVE METABOLIC PANEL
AG Ratio: 1.7 (calc) (ref 1.0–2.5)
ALBUMIN MSPROF: 4 g/dL (ref 3.6–5.1)
ALT: 13 U/L (ref 6–29)
AST: 18 U/L (ref 10–35)
Alkaline phosphatase (APISO): 65 U/L (ref 33–130)
BUN/Creatinine Ratio: 13 (calc) (ref 6–22)
BUN: 13 mg/dL (ref 7–25)
CHLORIDE: 106 mmol/L (ref 98–110)
CO2: 25 mmol/L (ref 20–32)
CREATININE: 1.04 mg/dL — AB (ref 0.50–0.99)
Calcium: 9.7 mg/dL (ref 8.6–10.4)
GLOBULIN: 2.4 g/dL (ref 1.9–3.7)
Glucose, Bld: 84 mg/dL (ref 65–99)
Potassium: 4.5 mmol/L (ref 3.5–5.3)
Sodium: 140 mmol/L (ref 135–146)
TOTAL PROTEIN: 6.4 g/dL (ref 6.1–8.1)
Total Bilirubin: 0.4 mg/dL (ref 0.2–1.2)

## 2017-08-21 LAB — T4, FREE: Free T4: 1.2 ng/dL (ref 0.8–1.8)

## 2017-08-21 LAB — LIPID PANEL
CHOLESTEROL: 192 mg/dL (ref <200)
HDL: 66 mg/dL (ref >50)
LDL CHOLESTEROL (CALC): 111 mg/dL — AB
Non-HDL Cholesterol (Calc): 126 mg/dL (calc) (ref <130)
TRIGLYCERIDES: 61 mg/dL (ref <150)
Total CHOL/HDL Ratio: 2.9 (calc) (ref <5.0)

## 2017-08-21 LAB — VITAMIN D 25 HYDROXY (VIT D DEFICIENCY, FRACTURES): Vit D, 25-Hydroxy: 55 ng/mL (ref 30–100)

## 2017-08-21 LAB — T3, FREE: T3, Free: 2.6 pg/mL (ref 2.3–4.2)

## 2017-08-21 LAB — TSH: TSH: 1.99 mIU/L (ref 0.40–4.50)

## 2017-08-23 ENCOUNTER — Other Ambulatory Visit: Payer: Self-pay | Admitting: *Deleted

## 2017-08-23 MED ORDER — SYNTHROID 100 MCG PO TABS: 100.0000 ug | ORAL_TABLET | Freq: Every day | ORAL | 3 refills | Status: DC

## 2017-09-01 ENCOUNTER — Ambulatory Visit
Admission: RE | Admit: 2017-09-01 | Discharge: 2017-09-01 | Disposition: A | Discharge status: Home / Self Care | Payer: Commercial Managed Care - PPO | Source: Ambulatory Visit | Attending: Family Medicine | Admitting: Family Medicine

## 2017-09-01 DIAGNOSIS — N951 Menopausal and female climacteric states: Secondary | ICD-10-CM

## 2017-09-01 LAB — COLOGUARD: COLOGUARD: POSITIVE

## 2017-09-02 ENCOUNTER — Encounter: Payer: Self-pay | Admitting: Family Medicine

## 2017-09-10 ENCOUNTER — Encounter: Payer: Self-pay | Admitting: *Deleted

## 2017-09-10 ENCOUNTER — Telehealth: Payer: Self-pay

## 2017-09-10 NOTE — Telephone Encounter (Signed)
Please print the cologuard report

## 2017-09-10 NOTE — Telephone Encounter (Signed)
Received patient Cologuard results.   Noted positive. MD reviewed and NO given for colonoscopy.   Call placed to patient. [place].   Referral orders placed.

## 2017-09-10 NOTE — Addendum Note (Signed)
Addended by: Sheral Flow on: 09/10/2017 12:56 PM   Modules accepted: Orders

## 2017-09-10 NOTE — Telephone Encounter (Signed)
Received a call from [facility] with [employer] patient cologuard results are abnormal.

## 2017-09-10 NOTE — Telephone Encounter (Signed)
Call placed to patient and patient made aware.  

## 2017-09-10 NOTE — Telephone Encounter (Signed)
Results printed for MD to review.   NO given for ref to GI for colonoscopy.

## 2017-10-11 ENCOUNTER — Encounter: Payer: Self-pay | Admitting: Family Medicine

## 2017-10-14 MED ORDER — SYNTHROID 100 MCG PO TABS: 100.0000 ug | ORAL_TABLET | Freq: Every day | ORAL | 3 refills | Status: DC

## 2017-10-14 MED ORDER — CLONAZEPAM 0.5 MG PO TABS: 0.5000 mg | ORAL_TABLET | Freq: Three times a day (TID) | ORAL | 3 refills | Status: DC | PRN

## 2017-10-14 NOTE — Telephone Encounter (Signed)
Ok to refill??  Last office visit 08/20/2017.  Last refill 07/18/2016, #3 refills.

## 2017-10-20 ENCOUNTER — Other Ambulatory Visit: Payer: Self-pay | Admitting: *Deleted

## 2017-10-20 ENCOUNTER — Telehealth: Payer: Self-pay | Admitting: Family Medicine

## 2017-10-20 MED ORDER — SYNTHROID 100 MCG PO TABS: 100.0000 ug | ORAL_TABLET | Freq: Every day | ORAL | 3 refills | Status: DC

## 2017-10-20 NOTE — Telephone Encounter (Signed)
Medication called to pharmacy. 

## 2017-10-20 NOTE — Telephone Encounter (Signed)
Pt needs klonopin resent to correct pharmacy [facility] [address].

## 2017-10-21 ENCOUNTER — Other Ambulatory Visit: Payer: Self-pay | Admitting: Family Medicine

## 2017-11-08 ENCOUNTER — Encounter: Payer: Self-pay | Admitting: Family Medicine

## 2017-11-18 ENCOUNTER — Encounter: Payer: Self-pay | Admitting: Family Medicine

## 2017-12-17 ENCOUNTER — Other Ambulatory Visit: Payer: Self-pay

## 2017-12-17 ENCOUNTER — Ambulatory Visit (INDEPENDENT_AMBULATORY_CARE_PROVIDER_SITE_OTHER): Payer: Medicare HMO | Admitting: Family Medicine

## 2017-12-17 ENCOUNTER — Encounter: Payer: Self-pay | Admitting: Family Medicine

## 2017-12-17 VITALS — BP 130/72 | HR 72 | Temp 98.5°F | Resp 16 | Ht 63.0 in | Wt 154.0 lb

## 2017-12-17 DIAGNOSIS — D1801 Hemangioma of skin and subcutaneous tissue: Secondary | ICD-10-CM | POA: Diagnosis not present

## 2017-12-17 DIAGNOSIS — I781 Nevus, non-neoplastic: Secondary | ICD-10-CM

## 2017-12-17 DIAGNOSIS — R195 Other fecal abnormalities: Secondary | ICD-10-CM | POA: Diagnosis not present

## 2017-12-17 DIAGNOSIS — B372 Candidiasis of skin and nail: Secondary | ICD-10-CM

## 2017-12-17 MED ORDER — CLOTRIMAZOLE-BETAMETHASONE 1-0.05 % EX CREA: 1.0000 "application " | TOPICAL_CREAM | Freq: Two times a day (BID) | CUTANEOUS | 0 refills | Status: DC

## 2017-12-17 MED ORDER — ZOSTER VAC RECOMB ADJUVANTED 50 MCG/0.5ML IM SUSR: 0.5000 mL | Freq: Once | INTRAMUSCULAR | 0 refills | Status: AC

## 2017-12-17 NOTE — Patient Instructions (Addendum)
Referral to GI  Use cream twice a day  Gold bond powder beneath breast  F/U as needed

## 2017-12-17 NOTE — Progress Notes (Signed)
   Subjective:    Patient ID: Wendy Mata, female    DOB: 1950-09-08, 67 y.o.   MRN: [id number]  Patient presents for Rash (x1 week- small reb bumps under L breast and some irritation under R breast- itching and tender to touch) She here with itchy red rash beneath both breasts.  States it has spread over the past week.  She also noted some red spots on her abdomen that are new.  She is not sure if this is related.  She has felt a little fatigued the past few days but no fever chills no nausea vomiting no upper respiratory symptoms.  [place].  No known sick contacts Has noticed that the rash worsened with heat and moisture That she also had positive Cologuard back in March she has not followed through with scheduling a colonoscopy.  The referral was actually closed out she is willing to do the colonoscopy.  Pt asked about shingrix vaccine  Review Of Systems:  GEN- denies fatigue, fever, weight loss,weakness, recent illness HEENT- denies eye drainage, change in vision, nasal discharge, CVS- denies chest pain, palpitations RESP- denies SOB, cough, wheeze ABD- denies N/V, change in stools, abd pain GU- denies dysuria, hematuria, dribbling, incontinence MSK- denies joint pain, muscle aches, injury Neuro- denies headache, dizziness, syncope, seizure activity       Objective:    BP 130/72   Pulse 72   Temp 98.5 F (36.9 C) (Oral)   Resp 16   Ht 5\' 3"  (1.6 m)   Wt 154 lb (69.9 kg)   SpO2 98%   BMI 27.28 kg/m  GEN- NAD, alert and oriented x3 HEENT- PERRL, EOMI, non injected sclera, pink conjunctiva, MMM, oropharynx clear CVS- RRR, no murmur RESP-CTAB ABD-NABS,soft,NT,ND Skin- scattered cherry angiomas on abdomen, back, multiple freckles , erythema beneath breast few maculopapular lesions, no discharge, no odor  EXT- No edema Pulses- Radial  2+        Assessment & Plan:      Problem List Items Addressed This Visit    None     Visit Diagnoses    Positive colorectal cancer screening using Cologuard test    -  Primary   Relevant Orders   Ambulatory referral to Gastroenterology   Candidal intertrigo       Consistent with yeast infection, given lotrisone due to itching, use golds bond powder which she has at home as well.    Relevant Medications   clotrimazole-betamethasone (LOTRISONE) cream   Zoster Vaccine Adjuvanted [facility]) injection   Cherry angioma       Given reassurance on these       Note: This dictation was prepared with Dragon dictation along with smaller phrase technology. Any transcriptional errors that result from this process are unintentional.

## 2018-01-21 DIAGNOSIS — H6121 Impacted cerumen, right ear: Secondary | ICD-10-CM | POA: Diagnosis not present

## 2018-01-21 DIAGNOSIS — T7849XA Other allergy, initial encounter: Secondary | ICD-10-CM | POA: Diagnosis not present

## 2018-02-16 ENCOUNTER — Other Ambulatory Visit: Payer: Self-pay | Admitting: *Deleted

## 2018-02-16 MED ORDER — CLONAZEPAM 0.5 MG PO TABS: 0.5000 mg | ORAL_TABLET | Freq: Three times a day (TID) | ORAL | 3 refills | Status: DC | PRN

## 2018-02-16 NOTE — Telephone Encounter (Signed)
Received fax requesting refill on Clonazepam.   Ok to refill??  Last office visit 12/17/2017.  Last refill 10/14/2017, #3 refills.

## 2018-02-17 ENCOUNTER — Encounter: Payer: Self-pay | Admitting: Family Medicine

## 2018-06-14 ENCOUNTER — Other Ambulatory Visit: Payer: Self-pay | Admitting: Family Medicine

## 2018-06-14 NOTE — Telephone Encounter (Signed)
Ok to refill??  Last office visit 12/17/2017.  Last refill 02/16/2018, #3 refills.

## 2018-07-20 ENCOUNTER — Encounter: Payer: Self-pay | Admitting: Family Medicine

## 2018-10-20 ENCOUNTER — Other Ambulatory Visit: Payer: Self-pay | Admitting: Family Medicine

## 2018-10-20 NOTE — Telephone Encounter (Signed)
Ok to refill??  Last office visit 12/17/2017.  Last refill 06/14/2018.

## 2018-11-20 ENCOUNTER — Other Ambulatory Visit: Payer: Self-pay | Admitting: Family Medicine

## 2018-11-22 NOTE — Telephone Encounter (Signed)
Requesting refill  Klonopin  LOV: 12/17/17  LRF:   10/20/18

## 2018-12-12 ENCOUNTER — Other Ambulatory Visit: Payer: Self-pay | Admitting: Family Medicine

## 2019-02-22 ENCOUNTER — Other Ambulatory Visit: Payer: Self-pay | Admitting: Family Medicine

## 2019-02-22 ENCOUNTER — Encounter: Payer: Self-pay | Admitting: Family Medicine

## 2019-02-22 NOTE — Telephone Encounter (Signed)
Ok to refill??  Last office visit 12/18/2018  Last refill 11/22/2018, #2 refills.

## 2019-05-29 ENCOUNTER — Other Ambulatory Visit: Payer: Self-pay | Admitting: Family Medicine

## 2019-05-29 MED ORDER — CLONAZEPAM 0.5 MG PO TABS: 0.5000 mg | ORAL_TABLET | Freq: Three times a day (TID) | ORAL | 0 refills | Status: DC | PRN

## 2019-05-29 NOTE — Telephone Encounter (Signed)
Pt can be scheduled for video telehealth visit, It has been > 1 year since her last visit and Klonopin is controlled substance  I will give 1 refill

## 2019-05-29 NOTE — Telephone Encounter (Signed)
Patient called requesting a refill on her klonopin. She would like this sent to [facility] in [place], [place]  CB# [phone]

## 2019-05-29 NOTE — Telephone Encounter (Signed)
Call placed to patient and patient made aware per VM.  

## 2019-05-29 NOTE — Telephone Encounter (Signed)
Ok to refill Klonopin??  Last office visit 12/17/2017.  Last refill 02/22/2019.  Of note, patient states that she is staying in [facility] until after the first of the year, at which poin she will schedule F/U with PCP.

## 2019-06-05 ENCOUNTER — Encounter: Payer: Self-pay | Admitting: Family Medicine

## 2019-06-06 ENCOUNTER — Telehealth (INDEPENDENT_AMBULATORY_CARE_PROVIDER_SITE_OTHER): Payer: Medicare HMO | Admitting: Family Medicine

## 2019-06-06 ENCOUNTER — Encounter: Payer: Self-pay | Admitting: Family Medicine

## 2019-06-06 DIAGNOSIS — K219 Gastro-esophageal reflux disease without esophagitis: Secondary | ICD-10-CM

## 2019-06-06 DIAGNOSIS — Z13220 Encounter for screening for lipoid disorders: Secondary | ICD-10-CM

## 2019-06-06 DIAGNOSIS — F411 Generalized anxiety disorder: Secondary | ICD-10-CM

## 2019-06-06 DIAGNOSIS — E559 Vitamin D deficiency, unspecified: Secondary | ICD-10-CM | POA: Diagnosis not present

## 2019-06-06 DIAGNOSIS — E038 Other specified hypothyroidism: Secondary | ICD-10-CM

## 2019-06-06 MED ORDER — CLONAZEPAM 0.5 MG PO TABS: 0.5000 mg | ORAL_TABLET | Freq: Three times a day (TID) | ORAL | 1 refills | Status: DC | PRN

## 2019-06-06 MED ORDER — SYNTHROID 100 MCG PO TABS: ORAL_TABLET | ORAL | 1 refills | Status: DC

## 2019-06-06 NOTE — Progress Notes (Signed)
Virtual Visit via Telephone Note  I connected with Wendy Mata on 06/06/19 at 9:08am by Video/Mychart visit and verified that I am speaking with the correct person using two identifiers.      Pt location: at home   Physician location:  In office, [facility], Vic Blackbird MD     On call: patient and physician   I discussed the limitations, risks, security and privacy concerns of performing an evaluation and management service by telephone and the availability of in person appointments. I also discussed with the patient that there may be a patient responsible charge related to this service. The patient expressed understanding and agreed to proceed.   History of Present Illness: Video visit to follow-up chronic medical problems.  Patient has been staying down in [place] for the most part since March.  States the area where she is there is less incidence of COVID-19.  She has been doing well.  She has been staying active.  States that she has lost about 10 pounds during this time by walking her dogs 3 times a day and trying to watch what she eats.  She is also been taking her supplements including multivitamin and vitamin D.  She does not have any concerns today.  Thyroidism she is taking her levothyroxine as prescribed no difficulties with the medications no new thyroid symptoms.  Acid reflux she takes the Prilosec as needed does not need this refilled.  Generalized anxiety  anxiety she uses her Klonopin twice a day most days.  She has decreased been taking 3 times a day.  She does admit that times are stressful in the setting of COVID-19 and all the changes but overall she feels like she is handling things well.   Observations/Objective: Video visit no acute distress noted normal work of breathing speaking in full sentences well-appearing  Assessment and Plan:  Hypothyroidism she will continue levothyroxine.  She is going to come back into town in January she will  notify us when she is come in and will have her fasting labs drawn at that time  Acid reflux as needed Prilosec improved with dietary changes and weight loss  Anxiety continue with clonazepam 2-3 times a day as needed  VITAMIN D Def- continue daily supplement recheck her level with lab draw  Review of overall maintenance.  She did have her flu shot performed in October at her local pharmacy.  She is due for Pneumovax 23 she wants to proceed with getting this at her local pharmacy.  She is due for mammogram recommended defer this into the spring.    Follow Up Instructions: Follow-up in office in April   I discussed the assessment and treatment plan with the patient. The patient was provided an opportunity to ask questions and all were answered. The patient agreed with the plan and demonstrated an understanding of the instructions.   The patient was advised to call back or seek an in-person evaluation if the symptoms worsen or if the condition fails to improve as anticipated.  I provided 13 minutes of non-face-to-face time during this encounter. End Time 9:21  Vic Blackbird, MD

## 2019-06-27 ENCOUNTER — Other Ambulatory Visit: Payer: Medicare HMO

## 2019-06-27 ENCOUNTER — Other Ambulatory Visit: Payer: Self-pay

## 2019-06-27 DIAGNOSIS — Z13220 Encounter for screening for lipoid disorders: Secondary | ICD-10-CM

## 2019-06-27 DIAGNOSIS — E038 Other specified hypothyroidism: Secondary | ICD-10-CM | POA: Diagnosis not present

## 2019-06-27 DIAGNOSIS — E559 Vitamin D deficiency, unspecified: Secondary | ICD-10-CM

## 2019-06-28 ENCOUNTER — Other Ambulatory Visit: Payer: Self-pay | Admitting: *Deleted

## 2019-06-28 ENCOUNTER — Encounter: Payer: Self-pay | Admitting: Family Medicine

## 2019-06-28 DIAGNOSIS — E038 Other specified hypothyroidism: Secondary | ICD-10-CM

## 2019-06-28 LAB — COMPREHENSIVE METABOLIC PANEL
AG Ratio: 1.6 (calc) (ref 1.0–2.5)
ALT: 10 U/L (ref 6–29)
AST: 14 U/L (ref 10–35)
Albumin: 3.9 g/dL (ref 3.6–5.1)
Alkaline phosphatase (APISO): 64 U/L (ref 37–153)
BUN: 12 mg/dL (ref 7–25)
CO2: 28 mmol/L (ref 20–32)
Calcium: 9.8 mg/dL (ref 8.6–10.4)
Chloride: 101 mmol/L (ref 98–110)
Creat: 0.97 mg/dL (ref 0.50–0.99)
Globulin: 2.4 g/dL (calc) (ref 1.9–3.7)
Glucose, Bld: 87 mg/dL (ref 65–99)
Potassium: 4.4 mmol/L (ref 3.5–5.3)
Sodium: 135 mmol/L (ref 135–146)
Total Bilirubin: 0.4 mg/dL (ref 0.2–1.2)
Total Protein: 6.3 g/dL (ref 6.1–8.1)

## 2019-06-28 LAB — VITAMIN D 25 HYDROXY (VIT D DEFICIENCY, FRACTURES): Vit D, 25-Hydroxy: 47 ng/mL (ref 30–100)

## 2019-06-28 LAB — CBC WITH DIFFERENTIAL/PLATELET
Absolute Monocytes: 596 cells/uL (ref 200–950)
Basophils Absolute: 71 cells/uL (ref 0–200)
Basophils Relative: 1 %
Eosinophils Absolute: 277 cells/uL (ref 15–500)
Eosinophils Relative: 3.9 %
HCT: 38.7 % (ref 35.0–45.0)
Hemoglobin: 13.4 g/dL (ref 11.7–15.5)
Lymphs Abs: 2478 cells/uL (ref 850–3900)
MCH: 34.5 pg — ABNORMAL HIGH (ref 27.0–33.0)
MCHC: 34.6 g/dL (ref 32.0–36.0)
MCV: 99.7 fL (ref 80.0–100.0)
MPV: 10.2 fL (ref 7.5–12.5)
Monocytes Relative: 8.4 %
Neutro Abs: 3678 cells/uL (ref 1500–7800)
Neutrophils Relative %: 51.8 %
Platelets: 293 10*3/uL (ref 140–400)
RBC: 3.88 10*6/uL (ref 3.80–5.10)
RDW: 11.8 % (ref 11.0–15.0)
Total Lymphocyte: 34.9 %
WBC: 7.1 10*3/uL (ref 3.8–10.8)

## 2019-06-28 LAB — T4, FREE: Free T4: 1.3 ng/dL (ref 0.8–1.8)

## 2019-06-28 LAB — T3, FREE: T3, Free: 2.6 pg/mL (ref 2.3–4.2)

## 2019-06-28 LAB — LIPID PANEL
Cholesterol: 179 mg/dL (ref <200)
HDL: 61 mg/dL (ref >=50)
LDL Cholesterol (Calc): 104 mg/dL (calc) — ABNORMAL HIGH
Non-HDL Cholesterol (Calc): 118 mg/dL (calc) (ref <130)
Total CHOL/HDL Ratio: 2.9 (calc) (ref <5.0)
Triglycerides: 57 mg/dL (ref <150)

## 2019-06-28 LAB — TSH: TSH: 4.94 mIU/L — ABNORMAL HIGH (ref 0.40–4.50)

## 2019-07-24 ENCOUNTER — Encounter: Payer: Self-pay | Admitting: Family Medicine

## 2019-08-09 ENCOUNTER — Other Ambulatory Visit: Payer: Self-pay

## 2019-08-09 ENCOUNTER — Ambulatory Visit (INDEPENDENT_AMBULATORY_CARE_PROVIDER_SITE_OTHER): Payer: Medicare HMO | Admitting: Family Medicine

## 2019-08-09 ENCOUNTER — Other Ambulatory Visit: Payer: Medicare HMO

## 2019-08-09 ENCOUNTER — Ambulatory Visit: Payer: Medicare HMO | Admitting: Family Medicine

## 2019-08-09 DIAGNOSIS — E038 Other specified hypothyroidism: Secondary | ICD-10-CM

## 2019-08-09 DIAGNOSIS — Z23 Encounter for immunization: Secondary | ICD-10-CM

## 2019-08-09 LAB — TSH: TSH: 1.03 mIU/L (ref 0.40–4.50)

## 2019-08-10 ENCOUNTER — Encounter: Payer: Self-pay | Admitting: Family Medicine

## 2019-08-26 ENCOUNTER — Other Ambulatory Visit: Payer: Self-pay | Admitting: Family Medicine

## 2019-08-28 NOTE — Telephone Encounter (Signed)
Ok to refill??  Last office visit 12/17/2017.  Last refill 06/06/2019, #1 refill.   Letter sent to patient to schedule OV.

## 2019-08-28 NOTE — Telephone Encounter (Signed)
Pt had video visit in Dec 2020

## 2019-09-13 ENCOUNTER — Ambulatory Visit (INDEPENDENT_AMBULATORY_CARE_PROVIDER_SITE_OTHER): Payer: Medicare HMO | Admitting: Family Medicine

## 2019-09-13 ENCOUNTER — Other Ambulatory Visit: Payer: Self-pay

## 2019-09-13 ENCOUNTER — Encounter: Payer: Self-pay | Admitting: Family Medicine

## 2019-09-13 VITALS — BP 102/68 | HR 82 | Temp 97.9°F | Resp 14 | Ht 63.0 in | Wt 149.0 lb

## 2019-09-13 DIAGNOSIS — J302 Other seasonal allergic rhinitis: Secondary | ICD-10-CM | POA: Diagnosis not present

## 2019-09-13 DIAGNOSIS — N390 Urinary tract infection, site not specified: Secondary | ICD-10-CM

## 2019-09-13 DIAGNOSIS — R300 Dysuria: Secondary | ICD-10-CM | POA: Diagnosis not present

## 2019-09-13 DIAGNOSIS — M545 Low back pain, unspecified: Secondary | ICD-10-CM

## 2019-09-13 DIAGNOSIS — R319 Hematuria, unspecified: Secondary | ICD-10-CM

## 2019-09-13 LAB — URINALYSIS, ROUTINE W REFLEX MICROSCOPIC
Bacteria, UA: NONE SEEN /HPF
Bilirubin Urine: NEGATIVE
Glucose, UA: NEGATIVE
Hyaline Cast: NONE SEEN /LPF
Ketones, ur: NEGATIVE
Leukocytes,Ua: NEGATIVE
Nitrite: NEGATIVE
Protein, ur: NEGATIVE
Specific Gravity, Urine: 1.004 (ref 1.001–1.03)
Squamous Epithelial / LPF: NONE SEEN /HPF (ref <=5)
WBC, UA: NONE SEEN /HPF (ref 0–5)
pH: 5.5 (ref 5.0–8.0)

## 2019-09-13 LAB — MICROSCOPIC MESSAGE

## 2019-09-13 MED ORDER — CETIRIZINE HCL 10 MG PO TABS: 10.0000 mg | ORAL_TABLET | Freq: Every day | ORAL | 3 refills | Status: DC | PRN

## 2019-09-13 MED ORDER — NITROFURANTOIN MONOHYD MACRO 100 MG PO CAPS: 100.0000 mg | ORAL_CAPSULE | Freq: Two times a day (BID) | ORAL | 0 refills | Status: DC

## 2019-09-13 NOTE — Progress Notes (Signed)
Subjective:    Patient ID: Wendy Mata, female    DOB: 04/27/1951, 69 y.o.   MRN: [id number]  Patient presents for Flank Pain (x3 weeks- S/P fall L hip disocloration), Dysuria (x3 weeks- urinary urgency, lower ABD pressure, urgency, frequency), and Seasonal  Allergy Symptoms (x1 month- allergies- itchy eyes, nasal drainage, ear pressure)  Patient here with a few different complaints.  For the past 10 days she has had urinary frequency urgency lower abdominal discomfort.  She has not had any gross blood in her urine.  She did try taking over-the-counter Pyridium with minimal improvement.  She is not had any fever vomiting change in bowel movement.  Allergies she has had itchy eyes sneezing ear pressure and drainage.  She has had blockage in her ear and had ENT flush this out so she started using over-the-counter swimmer's eardrops and noted a little bit of bleeding would like to have this checked today.  She is not sure what to take for the allergy symptoms.  Side pain she actually fell on her right hip when she was out of town 3 weeks ago.  She does admit that she was intoxicated at the time.  She missed a step and fell right onto her side.  She initially had a very large bruise on the lower back and hip as well as a knot but that has improved.  She iced it took NSAIDs and has been able to do her regular activities but still has some soreness.  Denies any pain radiating to her leg.   Review Of Systems:  GEN- denies fatigue, fever, weight loss,weakness, recent illness HEENT- denies eye drainage, change in vision, nasal discharge, CVS- denies chest pain, palpitations RESP- denies SOB, cough, wheeze ABD- denies N/V, change in stools, abd pain GU- denies dysuria, hematuria, dribbling, incontinence MSK- + joint pain, muscle aches, injury Neuro- denies headache, dizziness, syncope, seizure activity       Objective:    BP 102/68   Pulse 82   Temp 97.9 F (36.6 C) (Temporal)   Resp 14    Ht 5\' 3"  (1.6 m)   Wt 149 lb (67.6 kg)   SpO2 96%   BMI 26.39 kg/m  GEN- NAD, alert and oriented x3 HEENT- PERRL, EOMI, non injected sclera, pink conjunctiva, nares clear rhinorrhea, no maxillary sinus tenderness, TM clear no effusion  Neck- Supple, no LAD  CVS- RRR, no murmur RESP-CTAB ABD-NABS,soft,NT,ND, no CVA tenderness MSK- Spine NT, good ROM, mild TTP right paraspinals, fair ROM bilat hips/knees, neg SLR  Neuro- normal tone LE, sensation in tact, strength in tact bilat Skin- no bruise or hematoma on right flank/hip seen  EXT- No edema Pulses- Radial, DP- 2+        Assessment & Plan:      Problem List Items Addressed This Visit    None    Visit Diagnoses    Urinary tract infection with hematuria, site unspecified    -  Primary   Treat with macrobid for 7 days , push fluids    Relevant Medications   nitrofurantoin, macrocrystal-monohydrate, (MACROBID) 100 MG capsule   Other Relevant Orders   Urinalysis, Routine w reflex microscopic (Completed)   Seasonal allergies       trial of zyrtec, if eye symptoms dont improve add pataday   Acute right-sided low back pain without sciatica       MSK pain, no red flags one exam, back to her regular activites, hold on imaging, ICE, NSAID as  needed , call back if not improved       Note: This dictation was prepared with Dragon dictation along with smaller phrase technology. Any transcriptional errors that result from this process are unintentional.

## 2019-09-13 NOTE — Patient Instructions (Addendum)
Take the antibiotics Increase your water Try the zyrtec for allergies Call if not improving  F/U 6 months for Physical

## 2019-11-20 ENCOUNTER — Other Ambulatory Visit: Payer: Self-pay | Admitting: *Deleted

## 2019-11-20 MED ORDER — OMEPRAZOLE 20 MG PO CPDR: 20.0000 mg | DELAYED_RELEASE_CAPSULE | Freq: Every day | ORAL | 3 refills | Status: DC

## 2019-12-04 ENCOUNTER — Other Ambulatory Visit: Payer: Self-pay | Admitting: Family Medicine

## 2019-12-04 NOTE — Telephone Encounter (Signed)
Ok to refill??  Last office visit 09/13/2019.  Last refill 08/28/2019, #2 refills.

## 2020-01-17 ENCOUNTER — Other Ambulatory Visit: Payer: Self-pay | Admitting: Family Medicine

## 2020-02-07 ENCOUNTER — Other Ambulatory Visit: Payer: Self-pay | Admitting: Family Medicine

## 2020-02-07 MED ORDER — SYNTHROID 100 MCG PO TABS: ORAL_TABLET | ORAL | 1 refills | Status: DC

## 2020-02-07 NOTE — Telephone Encounter (Signed)
Refill Synthroid

## 2020-02-07 NOTE — Telephone Encounter (Signed)
Prescription sent to mail order pharmacy 

## 2020-02-26 ENCOUNTER — Telehealth: Payer: Self-pay | Admitting: Family Medicine

## 2020-02-26 NOTE — Telephone Encounter (Signed)
CB [phone] Pt would like to know is to soon get a thyroid test wanted to make sure insurance will pay

## 2020-02-28 ENCOUNTER — Other Ambulatory Visit: Payer: Self-pay

## 2020-02-28 ENCOUNTER — Other Ambulatory Visit: Payer: Medicare HMO

## 2020-02-28 DIAGNOSIS — Z87440 Personal history of urinary (tract) infections: Secondary | ICD-10-CM

## 2020-02-28 DIAGNOSIS — E559 Vitamin D deficiency, unspecified: Secondary | ICD-10-CM

## 2020-02-28 DIAGNOSIS — Z13220 Encounter for screening for lipoid disorders: Secondary | ICD-10-CM

## 2020-02-28 DIAGNOSIS — E038 Other specified hypothyroidism: Secondary | ICD-10-CM

## 2020-02-28 DIAGNOSIS — N951 Menopausal and female climacteric states: Secondary | ICD-10-CM | POA: Diagnosis not present

## 2020-02-28 DIAGNOSIS — Z136 Encounter for screening for cardiovascular disorders: Secondary | ICD-10-CM | POA: Diagnosis not present

## 2020-02-28 DIAGNOSIS — R300 Dysuria: Secondary | ICD-10-CM | POA: Diagnosis not present

## 2020-02-28 LAB — URINALYSIS, ROUTINE W REFLEX MICROSCOPIC
Bilirubin Urine: NEGATIVE
Glucose, UA: NEGATIVE
Hyaline Cast: NONE SEEN /LPF
Ketones, ur: NEGATIVE
Nitrite: NEGATIVE
Protein, ur: NEGATIVE
Specific Gravity, Urine: 1.004 (ref 1.001–1.03)
pH: 5.5 (ref 5.0–8.0)

## 2020-02-28 LAB — MICROSCOPIC MESSAGE

## 2020-02-29 ENCOUNTER — Encounter: Payer: Self-pay | Admitting: Family Medicine

## 2020-02-29 LAB — COMPLETE METABOLIC PANEL WITH GFR
AG Ratio: 1.5 (calc) (ref 1.0–2.5)
ALT: 15 U/L (ref 6–29)
AST: 23 U/L (ref 10–35)
Albumin: 3.8 g/dL (ref 3.6–5.1)
Alkaline phosphatase (APISO): 60 U/L (ref 37–153)
BUN/Creatinine Ratio: 9 (calc) (ref 6–22)
BUN: 11 mg/dL (ref 7–25)
CO2: 26 mmol/L (ref 20–32)
Calcium: 9.6 mg/dL (ref 8.6–10.4)
Chloride: 105 mmol/L (ref 98–110)
Creat: 1.21 mg/dL — ABNORMAL HIGH (ref 0.50–0.99)
GFR, Est African American: 53 mL/min/{1.73_m2} — ABNORMAL LOW (ref >=60)
GFR, Est Non African American: 46 mL/min/{1.73_m2} — ABNORMAL LOW (ref >=60)
Globulin: 2.6 g/dL (calc) (ref 1.9–3.7)
Glucose, Bld: 110 mg/dL — ABNORMAL HIGH (ref 65–99)
Potassium: 4.4 mmol/L (ref 3.5–5.3)
Sodium: 137 mmol/L (ref 135–146)
Total Bilirubin: 0.6 mg/dL (ref 0.2–1.2)
Total Protein: 6.4 g/dL (ref 6.1–8.1)

## 2020-02-29 LAB — VITAMIN D 25 HYDROXY (VIT D DEFICIENCY, FRACTURES): Vit D, 25-Hydroxy: 48 ng/mL (ref 30–100)

## 2020-02-29 LAB — CBC WITH DIFFERENTIAL/PLATELET
Absolute Monocytes: 441 cells/uL (ref 200–950)
Basophils Absolute: 91 cells/uL (ref 0–200)
Basophils Relative: 1.3 %
Eosinophils Absolute: 224 cells/uL (ref 15–500)
Eosinophils Relative: 3.2 %
HCT: 35.4 % (ref 35.0–45.0)
Hemoglobin: 12.3 g/dL (ref 11.7–15.5)
Lymphs Abs: 2016 cells/uL (ref 850–3900)
MCH: 34.8 pg — ABNORMAL HIGH (ref 27.0–33.0)
MCHC: 34.7 g/dL (ref 32.0–36.0)
MCV: 100.3 fL — ABNORMAL HIGH (ref 80.0–100.0)
MPV: 10.6 fL (ref 7.5–12.5)
Monocytes Relative: 6.3 %
Neutro Abs: 4228 cells/uL (ref 1500–7800)
Neutrophils Relative %: 60.4 %
Platelets: 266 10*3/uL (ref 140–400)
RBC: 3.53 10*6/uL — ABNORMAL LOW (ref 3.80–5.10)
RDW: 11.8 % (ref 11.0–15.0)
Total Lymphocyte: 28.8 %
WBC: 7 10*3/uL (ref 3.8–10.8)

## 2020-02-29 LAB — LIPID PANEL
Cholesterol: 197 mg/dL (ref <200)
HDL: 89 mg/dL (ref >=50)
LDL Cholesterol (Calc): 92 mg/dL (calc)
Non-HDL Cholesterol (Calc): 108 mg/dL (calc) (ref <130)
Total CHOL/HDL Ratio: 2.2 (calc) (ref <5.0)
Triglycerides: 70 mg/dL (ref <150)

## 2020-02-29 LAB — TSH: TSH: 2.09 mIU/L (ref 0.40–4.50)

## 2020-03-01 LAB — URINE CULTURE
MICRO NUMBER:: 10899185
SPECIMEN QUALITY:: ADEQUATE

## 2020-03-03 ENCOUNTER — Other Ambulatory Visit: Payer: Self-pay | Admitting: Family Medicine

## 2020-03-03 MED ORDER — SULFAMETHOXAZOLE-TRIMETHOPRIM 800-160 MG PO TABS: 1.0000 | ORAL_TABLET | Freq: Two times a day (BID) | ORAL | 0 refills | Status: DC

## 2020-03-05 NOTE — Telephone Encounter (Signed)
Call placed to patient and labs have been obtained.

## 2020-03-13 ENCOUNTER — Encounter: Payer: Self-pay | Admitting: Family Medicine

## 2020-03-13 ENCOUNTER — Other Ambulatory Visit: Payer: Self-pay

## 2020-03-13 ENCOUNTER — Ambulatory Visit (INDEPENDENT_AMBULATORY_CARE_PROVIDER_SITE_OTHER): Payer: Medicare HMO | Admitting: Family Medicine

## 2020-03-13 VITALS — BP 110/66 | HR 70 | Temp 97.9°F | Resp 14 | Ht 63.0 in | Wt 139.0 lb

## 2020-03-13 DIAGNOSIS — K130 Diseases of lips: Secondary | ICD-10-CM

## 2020-03-13 DIAGNOSIS — F411 Generalized anxiety disorder: Secondary | ICD-10-CM

## 2020-03-13 DIAGNOSIS — E038 Other specified hypothyroidism: Secondary | ICD-10-CM | POA: Diagnosis not present

## 2020-03-13 DIAGNOSIS — Z23 Encounter for immunization: Secondary | ICD-10-CM | POA: Diagnosis not present

## 2020-03-13 DIAGNOSIS — N289 Disorder of kidney and ureter, unspecified: Secondary | ICD-10-CM | POA: Diagnosis not present

## 2020-03-13 DIAGNOSIS — F17200 Nicotine dependence, unspecified, uncomplicated: Secondary | ICD-10-CM | POA: Diagnosis not present

## 2020-03-13 DIAGNOSIS — Z87440 Personal history of urinary (tract) infections: Secondary | ICD-10-CM | POA: Diagnosis not present

## 2020-03-13 DIAGNOSIS — K219 Gastro-esophageal reflux disease without esophagitis: Secondary | ICD-10-CM

## 2020-03-13 DIAGNOSIS — E559 Vitamin D deficiency, unspecified: Secondary | ICD-10-CM

## 2020-03-13 DIAGNOSIS — Z1231 Encounter for screening mammogram for malignant neoplasm of breast: Secondary | ICD-10-CM

## 2020-03-13 DIAGNOSIS — Z872 Personal history of diseases of the skin and subcutaneous tissue: Secondary | ICD-10-CM

## 2020-03-13 DIAGNOSIS — D229 Melanocytic nevi, unspecified: Secondary | ICD-10-CM

## 2020-03-13 LAB — URINALYSIS, ROUTINE W REFLEX MICROSCOPIC
Bilirubin Urine: NEGATIVE
Glucose, UA: NEGATIVE
Nitrite: NEGATIVE
Specific Gravity, Urine: 1.02 (ref 1.001–1.03)
pH: 5.5 (ref 5.0–8.0)

## 2020-03-13 LAB — MICROSCOPIC MESSAGE

## 2020-03-13 MED ORDER — LEVOTHYROXINE SODIUM 112 MCG PO TABS: 112.0000 ug | ORAL_TABLET | Freq: Every day | ORAL | 1 refills | Status: DC

## 2020-03-13 MED ORDER — MUPIROCIN 2 % EX OINT: 1.0000 "application " | TOPICAL_OINTMENT | Freq: Two times a day (BID) | CUTANEOUS | 0 refills | Status: DC

## 2020-03-13 NOTE — Assessment & Plan Note (Signed)
Increase to 127mcg Recheck labs in 6 weeks

## 2020-03-13 NOTE — Patient Instructions (Addendum)
F/U 6 months for Physical We will call with lab results Referral to Dr. Vernelle Emerald FOR LABS in November for your thyroid  Ointment for mouth sent

## 2020-03-13 NOTE — Assessment & Plan Note (Signed)
Symptoms controlled with low dose PPI

## 2020-03-13 NOTE — Assessment & Plan Note (Signed)
counsled on cessation 

## 2020-03-13 NOTE — Progress Notes (Signed)
Subjective:    Patient ID: Wendy Mata, female    DOB: [birth year], 69 y.o.   MRN: [id number]  Patient presents for Follow-up (is fasting) Patient here to follow-up chronic medical problems.  Medications reviewed. She had fasting labs performed 2  weeks ago.  These were reviewed at the bedside She was also found to have E. coli UTI which I treated her with Bactrim see phone note.  She is due for repeat urinalysis and culture today She has does not have any UTI symptoms   She had mild elevation in creatinine 1.21 but also in setting of E coli UTI  Due for recheck   Vitamin D at goal  Hypothyroidism - she feels like her meds need to be adjusted, she feels better when TSH level around 1, she gets weird sensations in skin and more fatigue she has intentionally lost 10lbs, [place] business [place] the [place] she is very active, mowing 5 days a week-walking   Due for mammogram  Discussed COVID-19 Vaccine Discussed Flu shot   GAD- taking klonopin prn   GERD- omeprazole prn   She has notices cracks on skide of he rmouth that she cant get to heal, has been using vaseline, lip moisutirzers, nothing has helped No mouth ulcers   Review Of Systems:  GEN- denies fatigue, fever, weight loss,weakness, recent illness HEENT- denies eye drainage, change in vision, nasal discharge, CVS- denies chest pain, palpitations RESP- denies SOB, cough, wheeze ABD- denies N/V, change in stools, abd pain GU- denies dysuria, hematuria, dribbling, incontinence MSK- denies joint pain, muscle aches, injury Neuro- denies headache, dizziness, syncope, seizure activity       Objective:    BP 110/66   Pulse 70   Temp 97.9 F (36.6 C) (Temporal)   Resp 14   Ht 5\' 3"  (1.6 m)   Wt 139 lb (63 kg)   SpO2 97%   BMI 24.62 kg/m  GEN- NAD, alert and oriented x3 HEENT- PERRL, EOMI, non injected sclera, pink conjunctiva, MMM, oropharynx clear Neck- Supple, no thyromegaly CVS- RRR, no  murmur RESP-CTAB ABD-NABS,soft,NT,ND Psych normal affect and mood  Skin- generalized sun damage, nevi, cracks at corner of mouth with mild erythema, Mild TTP EXT- No edema Pulses- Radial, DP- 2+        Assessment & Plan:      Problem List Items Addressed This Visit      Unprioritized   Anxiety state    Continue current meds ,typically takes 1 mg a day       GERD (gastroesophageal reflux disease)    Symptoms controlled with low dose PPI      Hypothyroidism    Increase to 150mcg Recheck labs in 6 weeks       Relevant Medications   levothyroxine (SYNTHROID) 112 MCG tablet   Other Relevant Orders   TSH   T3, free   Tobacco use disorder    counsled on cessation       Vitamin D deficiency    Other Visit Diagnoses    Hx: UTI (urinary tract infection)    -  Primary   Relevant Medications   mupirocin ointment (BACTROBAN) 2 %   Other Relevant Orders   Urinalysis, Routine w reflex microscopic (Completed)   Urine Culture   Need for immunization against influenza       Relevant Orders   Flu Vaccine QUAD High Dose(Fluad) (Completed)   Encounter for screening mammogram for malignant neoplasm of breast  Relevant Orders   MM 3D SCREEN BREAST BILATERAL   Acute renal insufficiency       Relevant Orders   Basic metabolic panel   Multiple nevi       Relevant Orders   Ambulatory referral to Dermatology   History of sun-damaged skin       Relevant Orders   Ambulatory referral to Dermatology   Angular cheilitis       Treat with bactroban topical       Note: This dictation was prepared with Dragon dictation along with smaller phrase technology. Any transcriptional errors that result from this process are unintentional.

## 2020-03-13 NOTE — Assessment & Plan Note (Signed)
Continue current meds ,typically takes 1 mg a day

## 2020-03-14 LAB — BASIC METABOLIC PANEL
BUN/Creatinine Ratio: 15 (calc) (ref 6–22)
BUN: 26 mg/dL — ABNORMAL HIGH (ref 7–25)
CO2: 24 mmol/L (ref 20–32)
Calcium: 9.2 mg/dL (ref 8.6–10.4)
Chloride: 103 mmol/L (ref 98–110)
Creat: 1.69 mg/dL — ABNORMAL HIGH (ref 0.50–0.99)
Glucose, Bld: 101 mg/dL — ABNORMAL HIGH (ref 65–99)
Potassium: 5.2 mmol/L (ref 3.5–5.3)
Sodium: 134 mmol/L — ABNORMAL LOW (ref 135–146)

## 2020-03-15 LAB — URINE CULTURE
MICRO NUMBER:: 10953733
SPECIMEN QUALITY:: ADEQUATE

## 2020-03-17 MED ORDER — CEPHALEXIN 500 MG PO CAPS: 500.0000 mg | ORAL_CAPSULE | Freq: Four times a day (QID) | ORAL | 0 refills | Status: DC

## 2020-03-17 NOTE — Addendum Note (Signed)
Addended by: Vic Blackbird F on: 03/17/2020 08:22 AM   Modules accepted: Orders

## 2020-03-18 ENCOUNTER — Other Ambulatory Visit: Payer: Self-pay | Admitting: Family Medicine

## 2020-03-18 ENCOUNTER — Encounter: Payer: Self-pay | Admitting: Family Medicine

## 2020-03-18 ENCOUNTER — Other Ambulatory Visit: Payer: Self-pay | Admitting: *Deleted

## 2020-03-18 DIAGNOSIS — Z87440 Personal history of urinary (tract) infections: Secondary | ICD-10-CM

## 2020-03-18 DIAGNOSIS — N179 Acute kidney failure, unspecified: Secondary | ICD-10-CM

## 2020-03-18 NOTE — Telephone Encounter (Signed)
Requested Prescriptions   Pending Prescriptions Disp Refills   clonazePAM (KLONOPIN) 0.5 MG tablet [Pharmacy Med Name: CLONAZEPAM 0.5MG  TABLETS] 90 tablet     Sig: TAKE 1 TABLET(0.5 MG) BY MOUTH THREE TIMES DAILY AS NEEDED FOR ANXIETY     Last OV 03/13/2020   Last written 12/04/2019

## 2020-03-22 ENCOUNTER — Other Ambulatory Visit: Payer: Self-pay | Admitting: *Deleted

## 2020-03-22 MED ORDER — SYNTHROID 112 MCG PO TABS: 112.0000 ug | ORAL_TABLET | Freq: Every day | ORAL | 1 refills | Status: DC

## 2020-03-25 ENCOUNTER — Other Ambulatory Visit: Payer: Self-pay | Admitting: *Deleted

## 2020-03-25 MED ORDER — SYNTHROID 112 MCG PO TABS
112.0000 ug | ORAL_TABLET | Freq: Every day | ORAL | 1 refills | Status: DC
Start: 2020-03-25 — End: 2020-07-29

## 2020-04-01 ENCOUNTER — Telehealth: Payer: Self-pay | Admitting: *Deleted

## 2020-04-01 MED ORDER — FLUCONAZOLE 150 MG PO TABS: 150.0000 mg | ORAL_TABLET | Freq: Once | ORAL | 0 refills | Status: DC

## 2020-04-01 MED ORDER — FLUCONAZOLE 150 MG PO TABS: 150.0000 mg | ORAL_TABLET | Freq: Once | ORAL | 0 refills | Status: AC

## 2020-04-01 NOTE — Telephone Encounter (Signed)
Medication called to pharmacy. 

## 2020-04-01 NOTE — Telephone Encounter (Signed)
Received call from patient.   Reports that she now has yeast infection due to increased ABTx muse.   Prescription sent to pharmacy for Diflucan.

## 2020-04-01 NOTE — Addendum Note (Signed)
Addended by: Sheral Flow on: 04/01/2020 12:43 PM   Modules accepted: Orders

## 2020-05-23 ENCOUNTER — Other Ambulatory Visit: Payer: Self-pay | Admitting: Family Medicine

## 2020-07-02 ENCOUNTER — Other Ambulatory Visit: Payer: Self-pay | Admitting: Family Medicine

## 2020-07-03 NOTE — Telephone Encounter (Signed)
Ok to refill??  Last office visit 03/13/2020.  Last refill 03/18/2020.

## 2020-07-29 ENCOUNTER — Other Ambulatory Visit: Payer: Self-pay | Admitting: *Deleted

## 2020-07-29 MED ORDER — SYNTHROID 112 MCG PO TABS: 112.0000 ug | ORAL_TABLET | Freq: Every day | ORAL | 1 refills | Status: DC

## 2020-08-10 ENCOUNTER — Other Ambulatory Visit: Payer: Self-pay | Admitting: Family Medicine

## 2020-09-10 ENCOUNTER — Other Ambulatory Visit: Payer: Self-pay | Admitting: Family Medicine

## 2020-10-03 ENCOUNTER — Other Ambulatory Visit: Payer: Self-pay | Admitting: Family Medicine

## 2020-10-03 MED ORDER — CLONAZEPAM 0.5 MG PO TABS
0.5000 mg | ORAL_TABLET | Freq: Three times a day (TID) | ORAL | 0 refills | Status: DC | PRN
Start: 2020-10-03 — End: 2020-10-21

## 2020-10-03 MED ORDER — OMEPRAZOLE 20 MG PO CPDR: DELAYED_RELEASE_CAPSULE | ORAL | 0 refills | Status: DC

## 2020-10-03 MED ORDER — SYNTHROID 112 MCG PO TABS: 112.0000 ug | ORAL_TABLET | Freq: Every day | ORAL | 0 refills | Status: DC

## 2020-10-03 MED ORDER — CETIRIZINE HCL 10 MG PO TABS: ORAL_TABLET | ORAL | 0 refills | Status: DC

## 2020-10-03 NOTE — Telephone Encounter (Signed)
Patient called requesting are fill on her synthroid called into [facility],   She is also requesting klonopin, zyrtec and omeprazole called into [facility] in [place], [place]  CB# [phone]

## 2020-10-03 NOTE — Telephone Encounter (Signed)
Patient left message for nurse Loistine Chance. with address and phone number of pharmacy.    [address] [place], [place] [address]  [phone].  Please call patient at [phone] if you require additional information.

## 2020-10-03 NOTE — Telephone Encounter (Signed)
Ok to refill Klonopin??  Last office visit 03/13/2020.  Last refill 08/12/2020.

## 2020-10-03 NOTE — Telephone Encounter (Signed)
Call placed to patient to verify pharmacy.   [place].

## 2020-10-21 ENCOUNTER — Telehealth: Payer: Self-pay

## 2020-10-21 ENCOUNTER — Encounter: Payer: Self-pay | Admitting: Family Medicine

## 2020-10-21 ENCOUNTER — Other Ambulatory Visit: Payer: Self-pay | Admitting: Family Medicine

## 2020-10-21 MED ORDER — CLONAZEPAM 0.5 MG PO TABS: 0.5000 mg | ORAL_TABLET | Freq: Three times a day (TID) | ORAL | 0 refills | Status: DC | PRN

## 2020-10-21 NOTE — Telephone Encounter (Signed)
Patient called to follow up on recent courtesy refills. Ten day supply prescribed for clonazePAM (KLONOPIN) 0.5 MG tablet [[id number]] - not enough to hold patient over until new patient appt with new provider on  11/28/2020.  Pharmacy:   [facility] [place], [address] [facility] [address]  [place], [place] [address]  Phone:  [phone] Fax:  [phone]  DEA #:  [id number]  Please advise at [phone].

## 2020-10-21 NOTE — Telephone Encounter (Signed)
Prescription corrected and re-submitted for approval.

## 2020-10-21 NOTE — Telephone Encounter (Signed)
Patient called back and was angry because she takes 3 pills a day; courtesy refill only contained 30 pills. Spoke with Margreta Journey; f patient calls back and disputes Rx any further, she will have to schedule an appointment with provider to discuss why additional medication needed.

## 2020-10-21 NOTE — Telephone Encounter (Signed)
As this medication is only to be taken on an as needed basis, this is a full 30 day supply.   If she requires another prescription, we will review in May/ June.

## 2020-10-21 NOTE — Telephone Encounter (Signed)
Last refill of 90 tabs given on 08/12/2020 with #1 refill.   Patient did not request refill again until 10/03/2020. Decreased quantity to 30 for PRN.   Patient is requesting full 90 tabs for refill as her new patient appointment will not be until June.   Please advise.

## 2020-11-19 ENCOUNTER — Other Ambulatory Visit: Payer: Self-pay | Admitting: Family Medicine

## 2020-11-19 ENCOUNTER — Encounter: Payer: Self-pay | Admitting: Nurse Practitioner

## 2020-11-20 NOTE — Telephone Encounter (Signed)
Wendy Mata - pt was seeing [facility]. Will you accept as a transfer of care?

## 2020-11-20 NOTE — Telephone Encounter (Signed)
Called to advise pt of below. Pt very upset and sited she does not need a psychiatrist for OCD. Pt asked why she wasn't advised when scheduling appt. Apologized for short notice. Pt asked what NP is and I explained. Pt asked "is she not capable of prescribing my medication?" Pt was advised providers in this practice can prescribe but do not prescribe/manage at her current usage. Pt said that Dr. Buelah Manis is leaving [place] and other doctors weren't able to take any more patients so she had to find a new doctor. Pt asked how I could know what was right for her & also stated she would not changed to an anti-depressant as other doctors have tried to change her to before since she is not depressed. Pt was very rude and raised voice at me. She did not request cancellation of appointment and before I could ask if she wanted to keep appointment she hung up on me.

## 2020-11-20 NOTE — Telephone Encounter (Signed)
Pt called back to apologize. She states that she was upset because of her doctor leaving and having to make changes after having seen her for several years. Pt wants to keep appt 6/1 with [facility]. Pt understands provider will help determine appropriate plan of care and that [facility] does not prescribe Klonopin.

## 2020-11-22 DIAGNOSIS — R030 Elevated blood-pressure reading, without diagnosis of hypertension: Secondary | ICD-10-CM | POA: Diagnosis not present

## 2020-11-22 DIAGNOSIS — J209 Acute bronchitis, unspecified: Secondary | ICD-10-CM | POA: Diagnosis not present

## 2020-11-22 DIAGNOSIS — J019 Acute sinusitis, unspecified: Secondary | ICD-10-CM | POA: Diagnosis not present

## 2020-11-22 DIAGNOSIS — F17210 Nicotine dependence, cigarettes, uncomplicated: Secondary | ICD-10-CM | POA: Diagnosis not present

## 2020-11-24 ENCOUNTER — Other Ambulatory Visit: Payer: Self-pay | Admitting: Family Medicine

## 2020-11-26 ENCOUNTER — Other Ambulatory Visit: Payer: Self-pay

## 2020-11-26 DIAGNOSIS — Z1231 Encounter for screening mammogram for malignant neoplasm of breast: Secondary | ICD-10-CM | POA: Diagnosis not present

## 2020-11-26 LAB — HM MAMMOGRAPHY

## 2020-11-27 ENCOUNTER — Encounter: Payer: Self-pay | Admitting: Nurse Practitioner

## 2020-11-27 ENCOUNTER — Ambulatory Visit (INDEPENDENT_AMBULATORY_CARE_PROVIDER_SITE_OTHER): Payer: Medicare HMO | Admitting: Nurse Practitioner

## 2020-11-27 VITALS — BP 120/84 | HR 82 | Temp 98.6°F | Ht 64.0 in | Wt 130.0 lb

## 2020-11-27 DIAGNOSIS — F411 Generalized anxiety disorder: Secondary | ICD-10-CM | POA: Diagnosis not present

## 2020-11-27 DIAGNOSIS — N289 Disorder of kidney and ureter, unspecified: Secondary | ICD-10-CM | POA: Diagnosis not present

## 2020-11-27 DIAGNOSIS — R739 Hyperglycemia, unspecified: Secondary | ICD-10-CM

## 2020-11-27 DIAGNOSIS — E038 Other specified hypothyroidism: Secondary | ICD-10-CM

## 2020-11-27 LAB — TSH: TSH: 0.13 u[IU]/mL — ABNORMAL LOW (ref 0.35–4.50)

## 2020-11-27 LAB — T4, FREE: Free T4: 1.05 ng/dL (ref 0.60–1.60)

## 2020-11-27 NOTE — Patient Instructions (Signed)
Go to lab for blood draw  your last cologuard was abnormal, so you need an appt with GI

## 2020-11-27 NOTE — Assessment & Plan Note (Addendum)
Chronic use of klonopin 0.5mg  TID She is not interested in use of SSRI or SNRI Per PMP database: 11/19/20 #30, 10/21/20 # 90, 09/10/20 #90, 08/12/20 #90, 07/03/20 #90 Declined referral to psychiatry for medication management. She is aware I will not be refilling klonopin rx

## 2020-11-27 NOTE — Progress Notes (Signed)
Subjective:  Patient ID: Wendy Mata, female    DOB: 03-06-1951  Age: 70 y.o. MRN: [id number]  CC: Establish Care (New patient/Would like medication refill on thyroid medication and would like to discuss Klonopin. )  HPI  Anxiety state Chronic use of klonopin 0.5mg  TID She is not interested in use of SSRI or SNRI Per PMP database: 11/19/20 #30, 10/21/20 # 90, 09/10/20 #90, 08/12/20 #90, 07/03/20 #90 Declined referral to psychiatry for medication management. She is aware I will not be refilling klonopin rx  Hypothyroidism Repeat TSH and T4  Reviewed past Medical, Social and Family history today.  Outpatient Medications Prior to Visit  Medication Sig Dispense Refill  . aspirin EC 81 MG tablet Take 81 mg by mouth daily.    . cetirizine (ZYRTEC) 10 MG tablet TAKE 1 TABLET BY MOUTH ONCE EVERY DAY AS NEEDED 30 tablet 0  . Cholecalciferol (VITAMIN D) 1000 UNITS capsule Take 2,000 Units by mouth daily.    . clonazePAM (KLONOPIN) 0.5 MG tablet Take 1 tablet (0.5 mg total) by mouth 3 (three) times daily as needed. for anxiety 90 tablet 0  . omeprazole (PRILOSEC) 20 MG capsule TAKE 1 CAPSULE(20 MG) BY MOUTH DAILY 90 capsule 0  . SYNTHROID 112 MCG tablet TAKE 1 TABLET EVERY DAY BEFORE BREAKFAST 90 tablet 0  . cephALEXin (KEFLEX) 500 MG capsule Take 1 capsule (500 mg total) by mouth 4 (four) times daily. 28 capsule 0  . mupirocin ointment (BACTROBAN) 2 % Apply 1 application topically 2 (two) times daily. To corners of mouth for 1 week (Patient not taking: Reported on 11/27/2020) 22 g 0   No facility-administered medications prior to visit.    ROS See HPI  Objective:  BP 120/84 (BP Location: Left Arm, Patient Position: Sitting, Cuff Size: Normal)   Pulse 82   Temp 98.6 F (37 C) (Temporal)   Ht 5\' 4"  (1.626 m)   Wt 130 lb (59 kg)   SpO2 98%   BMI 22.31 kg/m   Physical Exam Vitals reviewed.  Neck:     Thyroid: No thyroid mass, thyromegaly or thyroid tenderness.  Cardiovascular:      Rate and Rhythm: Normal rate and regular rhythm.     Pulses: Normal pulses.     Heart sounds: Normal heart sounds.  Pulmonary:     Effort: Pulmonary effort is normal.     Breath sounds: Normal breath sounds.  Musculoskeletal:     Cervical back: Normal range of motion and neck supple.  Lymphadenopathy:     Cervical: No cervical adenopathy.  Neurological:     Mental Status: She is alert and oriented to person, place, and time.  Psychiatric:        Mood and Affect: Mood normal.        Behavior: Behavior normal.        Thought Content: Thought content normal.    Assessment & Plan:  This visit occurred during the SARS-CoV-2 public health emergency.  Safety protocols were in place, including screening questions prior to the visit, additional usage of staff PPE, and extensive cleaning of exam room while observing appropriate contact time as indicated for disinfecting solutions.   Wendy Mata was seen today for establish care.  Diagnoses and all orders for this visit:  Other specified hypothyroidism -     Comprehensive metabolic panel -     TSH -     T4, free  Anxiety state    Problem List Items Addressed This Visit  Endocrine   Hypothyroidism - Primary    Repeat TSH and T4      Relevant Orders   Comprehensive metabolic panel   TSH   T4, free     Other   Anxiety state    Chronic use of klonopin 0.5mg  TID She is not interested in use of SSRI or SNRI Per PMP database: 11/19/20 #30, 10/21/20 # 90, 09/10/20 #90, 08/12/20 #90, 07/03/20 #90 Declined referral to psychiatry for medication management. She is aware I will not be refilling klonopin rx         Follow-up: Return in about 6 months (around 05/29/2021) for hypothyrodism.  Wilfred Lacy, NP

## 2020-11-28 ENCOUNTER — Encounter: Payer: Self-pay | Admitting: Nurse Practitioner

## 2020-11-28 NOTE — Assessment & Plan Note (Signed)
Repeat TSH and T4 °

## 2020-11-29 LAB — COMPREHENSIVE METABOLIC PANEL
ALT: 31 U/L (ref 0–35)
AST: 38 U/L — ABNORMAL HIGH (ref 0–37)
Albumin: 4.2 g/dL (ref 3.5–5.2)
Alkaline Phosphatase: 69 U/L (ref 39–117)
BUN: 39 mg/dL — ABNORMAL HIGH (ref 6–23)
CO2: 20 mEq/L (ref 19–32)
Calcium: 9.6 mg/dL (ref 8.4–10.5)
Chloride: 103 mEq/L (ref 96–112)
Creatinine, Ser: 1.53 mg/dL — ABNORMAL HIGH (ref 0.40–1.20)
GFR: 34.57 mL/min — ABNORMAL LOW (ref >60.00)
Glucose, Bld: 107 mg/dL — ABNORMAL HIGH (ref 70–99)
Potassium: 4.5 mEq/L (ref 3.5–5.1)
Sodium: 141 mEq/L (ref 135–145)
Total Bilirubin: 0.3 mg/dL (ref 0.2–1.2)
Total Protein: 6.9 g/dL (ref 6.0–8.3)

## 2020-11-29 NOTE — Addendum Note (Signed)
Addended by: Wilfred Lacy L on: 11/29/2020 02:15 PM   Modules accepted: Orders

## 2020-12-23 ENCOUNTER — Encounter: Payer: Self-pay | Admitting: Nurse Practitioner

## 2020-12-24 ENCOUNTER — Other Ambulatory Visit: Payer: Self-pay

## 2020-12-24 ENCOUNTER — Encounter: Payer: Self-pay | Admitting: Nurse Practitioner

## 2020-12-24 ENCOUNTER — Other Ambulatory Visit: Payer: Self-pay | Admitting: Family Medicine

## 2020-12-24 MED ORDER — SYNTHROID 112 MCG PO TABS: ORAL_TABLET | ORAL | 0 refills | Status: DC

## 2020-12-24 MED ORDER — OMEPRAZOLE 20 MG PO CPDR: DELAYED_RELEASE_CAPSULE | ORAL | 0 refills | Status: DC

## 2020-12-24 NOTE — Telephone Encounter (Signed)
Pt called to inform office that her Synthroid was sent to the wrong pharmacy.  I called to [facility] to stop the refill request so I can send to [facility] mail order pharmacy.  #90

## 2021-01-01 ENCOUNTER — Telehealth: Payer: Self-pay | Admitting: Nurse Practitioner

## 2021-01-01 NOTE — Telephone Encounter (Signed)
What is the name of the medication? omeprazole (PRILOSEC) 20 MG capsule [[id number]] and albuterol sulfate 90 mcg   Have you contacted your pharmacy to request a refill?  [facility] is calling for the pt.   Which pharmacy would you like this sent to? [facility] ([facility] - [place], [address], [place] [address]  Phone:  [phone]  Fax:  [phone]        Patient notified that their request is being sent to the clinical staff for review and that they should receive a call once it is complete. If they do not receive a call within 72 hours they can check with their pharmacy or our office.

## 2021-01-02 NOTE — Telephone Encounter (Signed)
Per last refill on 12/24/20, no additional refills, pt is to establish with new PCP

## 2021-02-08 DIAGNOSIS — Z1211 Encounter for screening for malignant neoplasm of colon: Secondary | ICD-10-CM | POA: Diagnosis not present

## 2021-02-08 LAB — FECAL OCCULT BLOOD, GUAIAC: Fecal Occult Blood: NEGATIVE

## 2021-02-25 ENCOUNTER — Telehealth: Payer: Self-pay | Admitting: Nurse Practitioner

## 2021-02-25 NOTE — Telephone Encounter (Signed)
Left message for patient to call back and schedule Medicare Annual Wellness Visit (AWV). Please offer to do virtually or by telephone.  Left office number and my jabber #[phone]. ? ?Due for AWVI ? ?Please schedule at anytime with Nurse Health Advisor. ?  ?

## 2021-03-15 ENCOUNTER — Telehealth: Payer: Self-pay

## 2021-03-15 NOTE — Telephone Encounter (Signed)
Lft VM to rtn call to schedule an AWV.  Dm/cma

## 2021-03-18 ENCOUNTER — Other Ambulatory Visit (INDEPENDENT_AMBULATORY_CARE_PROVIDER_SITE_OTHER): Payer: Medicare HMO

## 2021-03-18 ENCOUNTER — Other Ambulatory Visit: Payer: Self-pay

## 2021-03-18 DIAGNOSIS — E038 Other specified hypothyroidism: Secondary | ICD-10-CM | POA: Diagnosis not present

## 2021-03-18 DIAGNOSIS — N289 Disorder of kidney and ureter, unspecified: Secondary | ICD-10-CM

## 2021-03-18 DIAGNOSIS — R739 Hyperglycemia, unspecified: Secondary | ICD-10-CM

## 2021-03-18 LAB — BASIC METABOLIC PANEL
BUN: 17 mg/dL (ref 6–23)
CO2: 26 mEq/L (ref 19–32)
Calcium: 9.8 mg/dL (ref 8.4–10.5)
Chloride: 102 mEq/L (ref 96–112)
Creatinine, Ser: 1.01 mg/dL (ref 0.40–1.20)
GFR: 56.79 mL/min — ABNORMAL LOW (ref >60.00)
Glucose, Bld: 117 mg/dL — ABNORMAL HIGH (ref 70–99)
Potassium: 4.2 mEq/L (ref 3.5–5.1)
Sodium: 137 mEq/L (ref 135–145)

## 2021-03-18 LAB — HEMOGLOBIN A1C: Hgb A1c MFr Bld: 5.4 % (ref 4.6–6.5)

## 2021-03-18 LAB — T4, FREE: Free T4: 1.06 ng/dL (ref 0.60–1.60)

## 2021-03-18 LAB — TSH: TSH: 0.25 u[IU]/mL — ABNORMAL LOW (ref 0.35–5.50)

## 2021-03-21 ENCOUNTER — Encounter: Payer: Self-pay | Admitting: Nurse Practitioner

## 2021-03-21 ENCOUNTER — Telehealth: Payer: Self-pay | Admitting: Nurse Practitioner

## 2021-03-21 DIAGNOSIS — E038 Other specified hypothyroidism: Secondary | ICD-10-CM

## 2021-03-21 MED ORDER — SYNTHROID 100 MCG PO TABS: 100.0000 ug | ORAL_TABLET | Freq: Every day | ORAL | 1 refills | Status: DC

## 2021-03-21 NOTE — Telephone Encounter (Signed)
Rx already sent to pharmacy. Dm/cma

## 2021-03-21 NOTE — Addendum Note (Signed)
Addended by: Leana Gamer on: 03/21/2021 09:11 AM   Modules accepted: Orders

## 2021-03-21 NOTE — Telephone Encounter (Signed)
Patient sent a my chart message regarding lab results.  Advised that I didn't see that you had reviewed them yet. Please see her message and advise.  Thanks.  Dm/cma

## 2021-03-24 MED ORDER — SYNTHROID 100 MCG PO TABS: 100.0000 ug | ORAL_TABLET | Freq: Every day | ORAL | 1 refills | Status: DC

## 2021-03-24 MED ORDER — OMEPRAZOLE 20 MG PO CPDR: DELAYED_RELEASE_CAPSULE | ORAL | 1 refills | Status: AC

## 2021-03-24 NOTE — Telephone Encounter (Signed)
Received a refill request for : Albuterol Sulfate HFA 11mcg inhaler LR ?  LOV 11/27/20 FOV  none scheduled   Please review and advise.  Thanks. Dm/cma

## 2021-04-17 ENCOUNTER — Ambulatory Visit: Payer: Commercial Managed Care - PPO | Admitting: Nurse Practitioner

## 2021-04-22 ENCOUNTER — Telehealth: Payer: Self-pay | Admitting: Nurse Practitioner

## 2021-04-22 ENCOUNTER — Encounter: Payer: Self-pay | Admitting: Nurse Practitioner

## 2021-04-24 NOTE — Telephone Encounter (Signed)
Called patient yesterday 04/23/21 to inform her updated BP was needed but was unable to reach patient by phone.

## 2021-05-14 ENCOUNTER — Ambulatory Visit: Payer: Medicare HMO

## 2021-05-21 ENCOUNTER — Ambulatory Visit (INDEPENDENT_AMBULATORY_CARE_PROVIDER_SITE_OTHER): Payer: Medicare HMO

## 2021-05-21 DIAGNOSIS — Z0000 Encounter for general adult medical examination without abnormal findings: Secondary | ICD-10-CM | POA: Diagnosis not present

## 2021-05-21 NOTE — Progress Notes (Signed)
Subjective:   Wendy Mata is a 70 y.o. female who presents for Medicare Annual Initial preventive examination.I connected with Philippa Vessey today by telephone and verified that I am speaking with the correct person using two identifiers. Location patient: home Location provider: work Persons participating in the virtual visit: patient, provider.   I discussed the limitations, risks, security and privacy concerns of performing an evaluation and management service by telephone and the availability of in person appointments. I also discussed with the patient that there may be a patient responsible charge related to this service. The patient expressed understanding and verbally consented to this telephonic visit.    Interactive audio and video telecommunications were attempted between this provider and patient, however failed, due to patient having technical difficulties OR patient did not have access to video capability.  We continued and completed visit with audio only.    Review of Systems     Cardiac Risk Factors include: advanced age (>20men, >58 women)     Objective:    Today's Vitals   There is no height or weight on file to calculate BMI.  Advanced Directives 05/21/2021  Does Patient Have a Medical Advance Directive? Yes  Type of Paramedic of [place];Living will  Copy of [place] in Chart? No - copy requested    Current Medications (verified) Outpatient Encounter Medications as of 05/21/2021  Medication Sig   aspirin EC 81 MG tablet Take 81 mg by mouth daily.   cetirizine (ZYRTEC) 10 MG tablet TAKE 1 TABLET BY MOUTH ONCE EVERY DAY AS NEEDED   Cholecalciferol (VITAMIN D) 1000 UNITS capsule Take 2,000 Units by mouth daily.   omeprazole (PRILOSEC) 20 MG capsule TAKE 1 CAPSULE(20 MG) BY MOUTH DAILY   SYNTHROID 100 MCG tablet Take 1 tablet (100 mcg total) by mouth daily before breakfast.   clonazePAM (KLONOPIN) 0.5 MG tablet Take 1  tablet (0.5 mg total) by mouth 3 (three) times daily as needed. for anxiety (Patient not taking: Reported on 05/21/2021)   No facility-administered encounter medications on file as of 05/21/2021.    Allergies (verified) Bee venom, Levaquin [levofloxacin in d5w], and Penicillins   History: Past Medical History:  Diagnosis Date   Anxiety    Hypothyroidism    Tobacco user    Vitamin D deficiency    History reviewed. No pertinent surgical history. History reviewed. No pertinent family history. Social History   Socioeconomic History   Marital status: Divorced    Spouse name: Not on file   Number of children: Not on file   Years of education: Not on file   Highest education level: Not on file  Occupational History   Not on file  Tobacco Use   Smoking status: Every Day   Smokeless tobacco: Never  Substance and Sexual Activity   Alcohol use: Yes    Alcohol/week: 2.0 standard drinks    Types: 2 drink(s) per week   Drug use: No   Sexual activity: Yes  Other Topics Concern   Not on file  Social History Narrative   Not on file   Social Determinants of Health   Financial Resource Strain: Low Risk    Difficulty of Paying Living Expenses: Not hard at all  Food Insecurity: No Food Insecurity   Worried About Charity fundraiser in the Last Year: Never true   [place] in the Last Year: Never true  Transportation Needs: No Transportation Needs   Lack of  Transportation (Medical): No   Lack of Transportation (Non-Medical): No  Physical Activity: Sufficiently Active   Days of Exercise per Week: 5 days   Minutes of Exercise per Session: 60 min  Stress: No Stress Concern Present   Feeling of Stress : Not at all  Social Connections: Socially Isolated   Frequency of Communication with Friends and Family: Twice a week   Frequency of Social Gatherings with Friends and Family: Twice a week   Attends Religious Services: Never   Printmaker: No    Attends Music therapist: Never   Marital Status: Divorced    Tobacco Counseling Ready to quit: Not Answered Counseling given: Not Answered   Clinical Intake:  Pre-visit preparation completed: Yes  Pain : No/denies pain     Nutritional Risks: None Diabetes: No  How often do you need to have someone help you when you read instructions, pamphlets, or other written materials from your doctor or pharmacy?: 1 - Never What is the last grade level you completed in school?: [place]   Interpreter Needed?: No  Information entered by :: Wendy Noguchi,LPN   Activities of Daily Living In your present state of health, do you have any difficulty performing the following activities: 05/21/2021  Hearing? N  Vision? N  Difficulty concentrating or making decisions? N  Walking or climbing stairs? N  Dressing or bathing? N  Doing errands, shopping? N  Preparing Food and eating ? N  Using the Toilet? N  In the past six months, have you accidently leaked urine? N  Do you have problems with loss of bowel control? N  Managing your Medications? N  Managing your Finances? N  Housekeeping or managing your Housekeeping? N  Some recent data might be hidden    Patient Care Team: Nche, Charlene Brooke, NP as PCP - General (Internal Medicine)  Indicate any recent Medical Services you may have received from other than [facility] providers in the past year (date may be approximate).     Assessment:   This is a routine wellness examination for Wendy Mata.  Hearing/Vision screen Vision Screening - Comments:: Annual eye exams wear glasses   Dietary issues and exercise activities discussed: Current Exercise Habits: Home exercise routine;The patient has a physically strenuous job, but has no regular exercise apart from work., Type of exercise: walking, Time (Minutes): 60, Frequency (Times/Week): 5, Weekly Exercise (Minutes/Week): 300, Intensity: Mild, Exercise limited by: None  identified   Goals Addressed             This Visit's Progress    Protect My Health   On track      Depression Screen PHQ 2/9 Scores 05/21/2021 05/21/2021 03/13/2020 08/20/2017 12/09/2016 07/10/2016 10/25/2013  PHQ - 2 Score 0 0 0 0 0 0 0  PHQ- 9 Score - - 0 0 0 0 1    Fall Risk Fall Risk  05/21/2021 03/13/2020 08/20/2017 12/09/2016 07/10/2016  Falls in the past year? 0 0 No No No  Number falls in past yr: 0 0 - - -  Injury with Fall? 0 0 - - -  Risk for fall due to : - No Fall Risks - - -  Follow up Falls evaluation completed Falls evaluation completed - - -    FALL RISK PREVENTION PERTAINING TO THE HOME:  Any stairs in or around the home? No  If so, are there any without handrails? No  Home free of loose throw rugs in walkways, pet beds,  electrical cords, etc? Yes  Adequate lighting in your home to reduce risk of falls? Yes   ASSISTIVE DEVICES UTILIZED TO PREVENT FALLS:  Life alert? No  Use of a cane, walker or w/c? No  Grab bars in the bathroom? No  Shower chair or bench in shower? No  Elevated toilet seat or a handicapped toilet? No   Cognitive Function:  Normal cognitive status assessed by direct observation by this Nurse Health Advisor. No abnormalities found.        Immunizations Immunization History  Administered Date(s) Administered   Fluad Quad(high Dose 65+) 03/13/2020   Influenza, High Dose Seasonal PF 05/18/2018, 04/18/2019   Influenza,inj,Quad PF,6+ Mos 03/27/2013, 05/21/2014, 04/14/2016   Influenza-Unspecified 03/31/2012, 03/31/2017   Pneumococcal Conjugate-13 08/20/2017   Pneumococcal Polysaccharide-23 05/21/2014, 08/09/2019   Tdap 06/04/2015   Zoster, Live 03/31/2012    TDAP status: Up to date  Flu Vaccine status: Up to date  Pneumococcal vaccine status: Up to date  Covid-19 vaccine status: Declined, Education has been provided regarding the importance of this vaccine but patient still declined. Advised may receive this vaccine at local  pharmacy or Health Dept.or vaccine clinic. Aware to provide a copy of the vaccination record if obtained from local pharmacy or Health Dept. Verbalized acceptance and understanding.  Qualifies for Shingles Vaccine? Yes   Zostavax completed No   Shingrix Completed?: No.    Education has been provided regarding the importance of this vaccine. Patient has been advised to call insurance company to determine out of pocket expense if they have not yet received this vaccine. Advised may also receive vaccine at local pharmacy or Health Dept. Verbalized acceptance and understanding.  Screening Tests Health Maintenance  Topic Date Due   COVID-19 Vaccine (1) Never done   Zoster Vaccines- Shingrix (1 of 2) Never done   Fecal DNA (Cologuard)  09/01/2020   INFLUENZA VACCINE  01/27/2021   MAMMOGRAM  11/26/2021   TETANUS/TDAP  06/03/2025   Pneumonia Vaccine 55+ Years old  Completed   DEXA SCAN  Completed   Hepatitis C Screening  Completed   HPV VACCINES  Aged Out    Health Maintenance  Health Maintenance Due  Topic Date Due   COVID-19 Vaccine (1) Never done   Zoster Vaccines- Shingrix (1 of 2) Never done   Fecal DNA (Cologuard)  09/01/2020   INFLUENZA VACCINE  01/27/2021    Colorectal cancer screening: Type of screening: Cologuard. Completed 02/15/2021. Repeat every 3 years  Mammogram status: Completed 10/29/2020. Repeat every year  Bone Density status: Completed 09/01/2017. Results reflect: Bone density results: OSTEOPENIA. Repeat every 5 years.  Lung Cancer Screening: (Low Dose CT Chest recommended if Age 75-80 years, 30 pack-year currently smoking OR have quit w/in 15years.) does qualify.   Lung Cancer Screening Referral: patient declines   Additional Screening:  Hepatitis C Screening: does not qualify; Completed 06/04/2015  Vision Screening: Recommended annual ophthalmology exams for early detection of glaucoma and other disorders of the eye. Is the patient up to date with their  annual eye exam?  Yes  Who is the provider or what is the name of the office in which the patient attends annual eye exams? Dr.Forsee  If pt is not established with a provider, would they like to be referred to a provider to establish care? No .   Dental Screening: Recommended annual dental exams for proper oral hygiene  Community Resource Referral / Chronic Care Management: CRR required this visit?  No   CCM required this  visit?  No      Plan:     I have personally reviewed and noted the following in the patient's chart:   Medical and social history Use of alcohol, tobacco or illicit drugs  Current medications and supplements including opioid prescriptions.  Functional ability and status Nutritional status Physical activity Advanced directives List of other physicians Hospitalizations, surgeries, and ER visits in previous 12 months Vitals Screenings to include cognitive, depression, and falls Referrals and appointments  In addition, I have reviewed and discussed with patient certain preventive protocols, quality metrics, and best practice recommendations. A written personalized care plan for preventive services as well as general preventive health recommendations were provided to patient.     Randel Pigg, LPN   20/08/7942   Nurse Notes: none

## 2021-05-21 NOTE — Patient Instructions (Signed)
Wendy Mata , Thank you for taking time to come for your Medicare Wellness Visit. I appreciate your ongoing commitment to your health goals. Please review the following plan we discussed and let me know if I can assist you in the future.   Screening recommendations/referrals: Colonoscopy:Cologuard  02/15/2021 Mammogram: 11/26/2020 Bone Density: 09/11/2017 Recommended yearly ophthalmology/optometry visit for glaucoma screening and checkup Recommended yearly dental visit for hygiene and checkup  Vaccinations: Influenza vaccine: completed  Pneumococcal vaccine: completed  Tdap vaccine: 06/04/2015 Shingles vaccine: will consider     Advanced directives: will provide copies   Conditions/risks identified: none   Next appointment: none    Preventive Care 69 Years and Older, Female Preventive care refers to lifestyle choices and visits with your health care provider that can promote health and wellness. What does preventive care include? A yearly physical exam. This is also called an annual well check. Dental exams once or twice a year. Routine eye exams. Ask your health care provider how often you should have your eyes checked. Personal lifestyle choices, including: Daily care of your teeth and gums. Regular physical activity. Eating a healthy diet. Avoiding tobacco and drug use. Limiting alcohol use. Practicing safe sex. Taking low-dose aspirin every day. Taking vitamin and mineral supplements as recommended by your health care provider. What happens during an annual well check? The services and screenings done by your health care provider during your annual well check will depend on your age, overall health, lifestyle risk factors, and family history of disease. Counseling  Your health care provider may ask you questions about your: Alcohol use. Tobacco use. Drug use. Emotional well-being. Home and relationship well-being. Sexual activity. Eating habits. History of  falls. Memory and ability to understand (cognition). Work and work Statistician. Reproductive health. Screening  You may have the following tests or measurements: Height, weight, and BMI. Blood pressure. Lipid and cholesterol levels. These may be checked every 5 years, or more frequently if you are over 81 years old. Skin check. Lung cancer screening. You may have this screening every year starting at age 22 if you have a 30-pack-year history of smoking and currently smoke or have quit within the past 15 years. Fecal occult blood test (FOBT) of the stool. You may have this test every year starting at age 46. Flexible sigmoidoscopy or colonoscopy. You may have a sigmoidoscopy every 5 years or a colonoscopy every 10 years starting at age 2. Hepatitis C blood test. Hepatitis B blood test. Sexually transmitted disease (STD) testing. Diabetes screening. This is done by checking your blood sugar (glucose) after you have not eaten for a while (fasting). You may have this done every 1-3 years. Bone density scan. This is done to screen for osteoporosis. You may have this done starting at age 10. Mammogram. This may be done every 1-2 years. Talk to your health care provider about how often you should have regular mammograms. Talk with your health care provider about your test results, treatment options, and if necessary, the need for more tests. Vaccines  Your health care provider may recommend certain vaccines, such as: Influenza vaccine. This is recommended every year. Tetanus, diphtheria, and acellular pertussis (Tdap, Td) vaccine. You may need a Td booster every 10 years. Zoster vaccine. You may need this after age 12. Pneumococcal 13-valent conjugate (PCV13) vaccine. One dose is recommended after age 84. Pneumococcal polysaccharide (PPSV23) vaccine. One dose is recommended after age 37. Talk to your health care provider about which screenings and vaccines you need  and how often you need  them. This information is not intended to replace advice given to you by your health care provider. Make sure you discuss any questions you have with your health care provider. Document Released: 07/12/2015 Document Revised: 03/04/2016 Document Reviewed: 04/16/2015 Elsevier Interactive Patient Education  2017 [place] Prevention in the Home Falls can cause injuries. They can happen to people of all ages. There are many things you can do to make your home safe and to help prevent falls. What can I do on the outside of my home? Regularly fix the edges of walkways and driveways and fix any cracks. Remove anything that might make you trip as you walk through a door, such as a raised step or threshold. Trim any bushes or trees on the path to your home. Use bright outdoor lighting. Clear any walking paths of anything that might make someone trip, such as rocks or tools. Regularly check to see if handrails are loose or broken. Make sure that both sides of any steps have handrails. Any raised decks and porches should have guardrails on the edges. Have any leaves, snow, or ice cleared regularly. Use sand or salt on walking paths during winter. Clean up any spills in your garage right away. This includes oil or grease spills. What can I do in the bathroom? Use night lights. Install grab bars by the toilet and in the tub and shower. Do not use towel bars as grab bars. Use non-skid mats or decals in the tub or shower. If you need to sit down in the shower, use a plastic, non-slip stool. Keep the floor dry. Clean up any water that spills on the floor as soon as it happens. Remove soap buildup in the tub or shower regularly. Attach bath mats securely with double-sided non-slip rug tape. Do not have throw rugs and other things on the floor that can make you trip. What can I do in the bedroom? Use night lights. Make sure that you have a light by your bed that is easy to reach. Do not use  any sheets or blankets that are too big for your bed. They should not hang down onto the floor. Have a firm chair that has side arms. You can use this for support while you get dressed. Do not have throw rugs and other things on the floor that can make you trip. What can I do in the kitchen? Clean up any spills right away. Avoid walking on wet floors. Keep items that you use a lot in easy-to-reach places. If you need to reach something above you, use a strong step stool that has a grab bar. Keep electrical cords out of the way. Do not use floor polish or wax that makes floors slippery. If you must use wax, use non-skid floor wax. Do not have throw rugs and other things on the floor that can make you trip. What can I do with my stairs? Do not leave any items on the stairs. Make sure that there are handrails on both sides of the stairs and use them. Fix handrails that are broken or loose. Make sure that handrails are as long as the stairways. Check any carpeting to make sure that it is firmly attached to the stairs. Fix any carpet that is loose or worn. Avoid having throw rugs at the top or bottom of the stairs. If you do have throw rugs, attach them to the floor with carpet tape. Make sure that you  have a light switch at the top of the stairs and the bottom of the stairs. If you do not have them, ask someone to add them for you. What else can I do to help prevent falls? Wear shoes that: Do not have high heels. Have rubber bottoms. Are comfortable and fit you well. Are closed at the toe. Do not wear sandals. If you use a stepladder: Make sure that it is fully opened. Do not climb a closed stepladder. Make sure that both sides of the stepladder are locked into place. Ask someone to hold it for you, if possible. Clearly mark and make sure that you can see: Any grab bars or handrails. First and last steps. Where the edge of each step is. Use tools that help you move around (mobility aids)  if they are needed. These include: Canes. Walkers. Scooters. Crutches. Turn on the lights when you go into a dark area. Replace any light bulbs as soon as they burn out. Set up your furniture so you have a clear path. Avoid moving your furniture around. If any of your floors are uneven, fix them. If there are any pets around you, be aware of where they are. Review your medicines with your doctor. Some medicines can make you feel dizzy. This can increase your chance of falling. Ask your doctor what other things that you can do to help prevent falls. This information is not intended to replace advice given to you by your health care provider. Make sure you discuss any questions you have with your health care provider. Document Released: 04/11/2009 Document Revised: 11/21/2015 Document Reviewed: 07/20/2014 Elsevier Interactive Patient Education  2017 [employer].

## 2021-06-27 ENCOUNTER — Other Ambulatory Visit: Payer: Self-pay | Admitting: Family Medicine

## 2021-07-09 DIAGNOSIS — I10 Essential (primary) hypertension: Secondary | ICD-10-CM | POA: Diagnosis not present

## 2021-07-09 DIAGNOSIS — F17210 Nicotine dependence, cigarettes, uncomplicated: Secondary | ICD-10-CM | POA: Diagnosis not present

## 2021-07-09 DIAGNOSIS — R519 Headache, unspecified: Secondary | ICD-10-CM | POA: Diagnosis not present

## 2021-07-09 DIAGNOSIS — Z20822 Contact with and (suspected) exposure to covid-19: Secondary | ICD-10-CM | POA: Diagnosis not present

## 2021-07-09 DIAGNOSIS — R42 Dizziness and giddiness: Secondary | ICD-10-CM | POA: Diagnosis not present

## 2021-07-09 DIAGNOSIS — G44229 Chronic tension-type headache, not intractable: Secondary | ICD-10-CM | POA: Diagnosis not present

## 2021-07-09 DIAGNOSIS — Z743 Need for continuous supervision: Secondary | ICD-10-CM | POA: Diagnosis not present

## 2021-07-21 ENCOUNTER — Encounter: Payer: Self-pay | Admitting: Nurse Practitioner

## 2021-07-21 ENCOUNTER — Ambulatory Visit (INDEPENDENT_AMBULATORY_CARE_PROVIDER_SITE_OTHER): Payer: Medicare HMO | Admitting: Nurse Practitioner

## 2021-07-21 ENCOUNTER — Other Ambulatory Visit: Payer: Self-pay

## 2021-07-21 ENCOUNTER — Ambulatory Visit (INDEPENDENT_AMBULATORY_CARE_PROVIDER_SITE_OTHER): Payer: Medicare HMO

## 2021-07-21 ENCOUNTER — Telehealth: Payer: Self-pay | Admitting: Nurse Practitioner

## 2021-07-21 VITALS — BP 138/70 | HR 76 | Temp 98.2°F | Ht 61.75 in | Wt 128.8 lb

## 2021-07-21 DIAGNOSIS — E038 Other specified hypothyroidism: Secondary | ICD-10-CM | POA: Diagnosis not present

## 2021-07-21 DIAGNOSIS — R0602 Shortness of breath: Secondary | ICD-10-CM | POA: Diagnosis not present

## 2021-07-21 DIAGNOSIS — R051 Acute cough: Secondary | ICD-10-CM

## 2021-07-21 DIAGNOSIS — R509 Fever, unspecified: Secondary | ICD-10-CM | POA: Diagnosis not present

## 2021-07-21 DIAGNOSIS — Z136 Encounter for screening for cardiovascular disorders: Secondary | ICD-10-CM

## 2021-07-21 DIAGNOSIS — E559 Vitamin D deficiency, unspecified: Secondary | ICD-10-CM

## 2021-07-21 DIAGNOSIS — F17200 Nicotine dependence, unspecified, uncomplicated: Secondary | ICD-10-CM

## 2021-07-21 DIAGNOSIS — Z716 Tobacco abuse counseling: Secondary | ICD-10-CM | POA: Diagnosis not present

## 2021-07-21 DIAGNOSIS — Z13220 Encounter for screening for lipoid disorders: Secondary | ICD-10-CM

## 2021-07-21 DIAGNOSIS — F411 Generalized anxiety disorder: Secondary | ICD-10-CM

## 2021-07-21 DIAGNOSIS — D72825 Bandemia: Secondary | ICD-10-CM

## 2021-07-21 DIAGNOSIS — E871 Hypo-osmolality and hyponatremia: Secondary | ICD-10-CM

## 2021-07-21 DIAGNOSIS — R059 Cough, unspecified: Secondary | ICD-10-CM | POA: Diagnosis not present

## 2021-07-21 LAB — COMPREHENSIVE METABOLIC PANEL
ALT: 10 U/L (ref 0–35)
AST: 17 U/L (ref 0–37)
Albumin: 4.1 g/dL (ref 3.5–5.2)
Alkaline Phosphatase: 72 U/L (ref 39–117)
BUN: 11 mg/dL (ref 6–23)
CO2: 23 mEq/L (ref 19–32)
Calcium: 9.8 mg/dL (ref 8.4–10.5)
Chloride: 93 mEq/L — ABNORMAL LOW (ref 96–112)
Creatinine, Ser: 0.99 mg/dL (ref 0.40–1.20)
GFR: 58.03 mL/min — ABNORMAL LOW (ref >60.00)
Glucose, Bld: 97 mg/dL (ref 70–99)
Potassium: 4.5 mEq/L (ref 3.5–5.1)
Sodium: 125 mEq/L — ABNORMAL LOW (ref 135–145)
Total Bilirubin: 0.4 mg/dL (ref 0.2–1.2)
Total Protein: 7 g/dL (ref 6.0–8.3)

## 2021-07-21 LAB — T4, FREE: Free T4: 1.08 ng/dL (ref 0.60–1.60)

## 2021-07-21 LAB — CBC
HCT: 33.8 % — ABNORMAL LOW (ref 36.0–46.0)
Hemoglobin: 11.6 g/dL — ABNORMAL LOW (ref 12.0–15.0)
MCHC: 34.2 g/dL (ref 30.0–36.0)
MCV: 99 fl (ref 78.0–100.0)
Platelets: 404 10*3/uL — ABNORMAL HIGH (ref 150.0–400.0)
RBC: 3.42 Mil/uL — ABNORMAL LOW (ref 3.87–5.11)
RDW: 12.8 % (ref 11.5–15.5)
WBC: 11.9 10*3/uL — ABNORMAL HIGH (ref 4.0–10.5)

## 2021-07-21 LAB — LDL CHOLESTEROL, DIRECT: Direct LDL: 77 mg/dL

## 2021-07-21 LAB — TSH: TSH: 4.87 u[IU]/mL (ref 0.35–5.50)

## 2021-07-21 IMAGING — DX DG CHEST 2V
2 series · 2 of 2 positions shown · non-contrast
Comparison: X-ray chest [DATE].

CLINICAL DATA: Fever, cough and shortness of breath for 2 weeks.
History of smoking 1 pack per day.

EXAM:
CHEST - 2 VIEW

[chest pa]
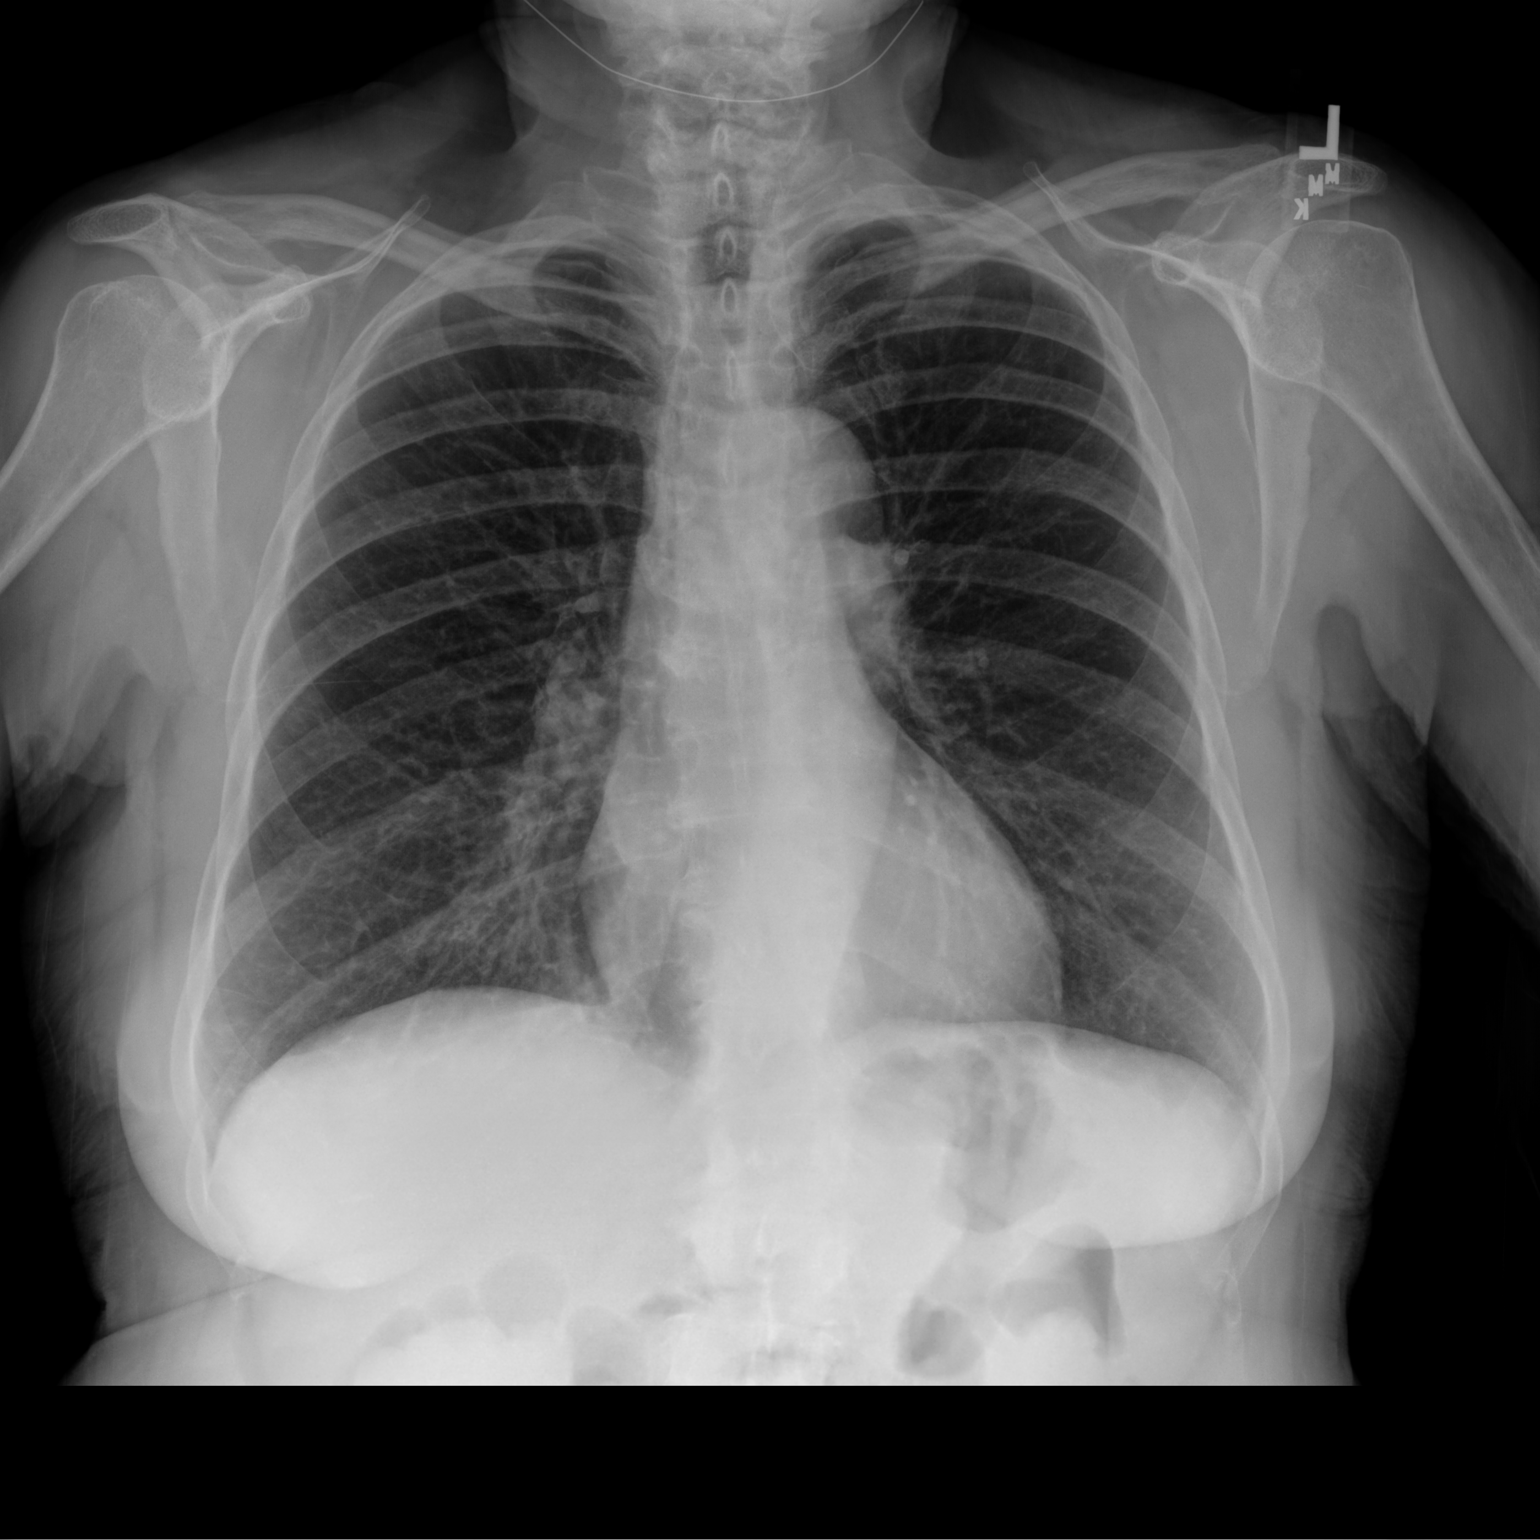

[chest lat]
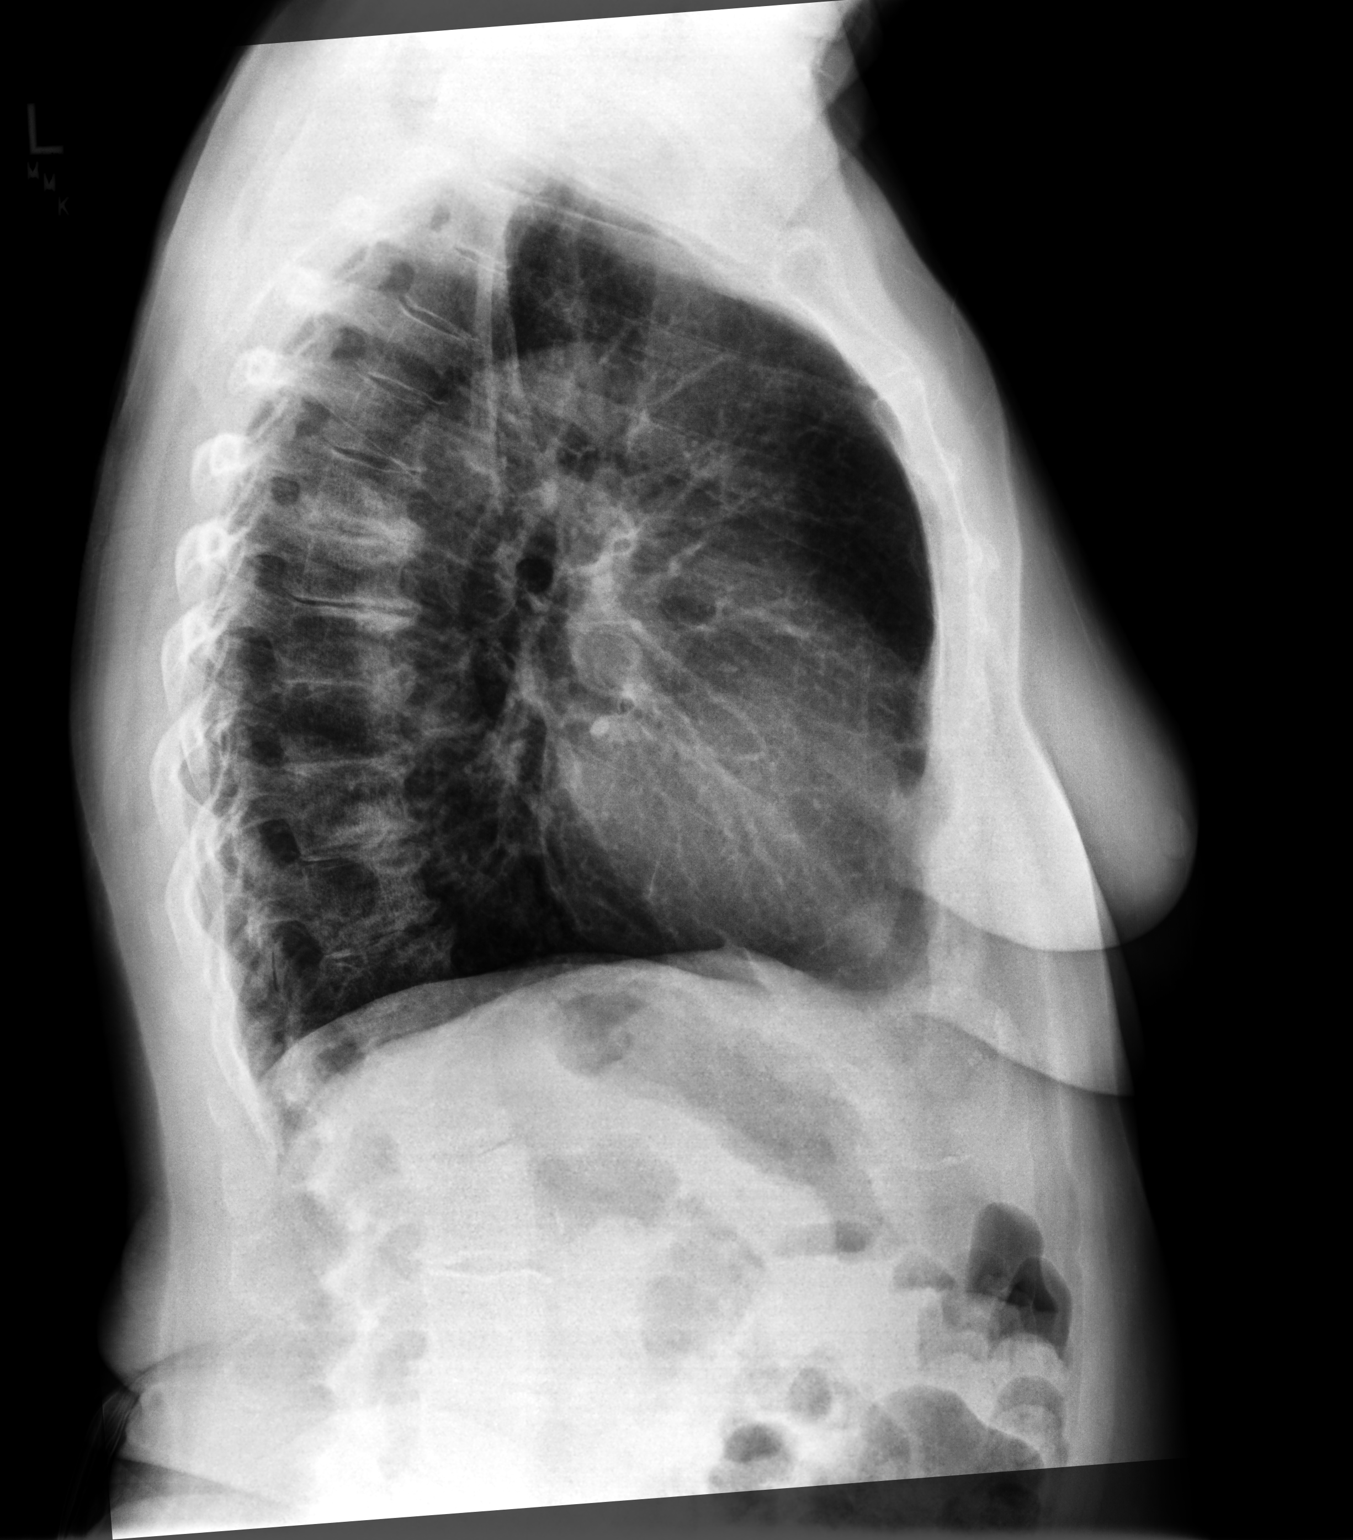

[2 of 2 positions shown; findings below may reference images not displayed]

FINDINGS: The heart size and mediastinal contours are within normal limits. No
focal airspace consolidation, pleural effusion, or pneumothorax.
Multilevel degenerative changes within the thoracic spine.
IMPRESSION: No active cardiopulmonary disease.

## 2021-07-21 MED ORDER — PROMETHAZINE-DM 6.25-15 MG/5ML PO SYRP
5.0000 mL | ORAL_SOLUTION | Freq: Four times a day (QID) | ORAL | 0 refills | Status: AC | PRN
Start: 2021-07-21 — End: ?

## 2021-07-21 MED ORDER — NICOTINE 7 MG/24HR TD PT24: 7.0000 mg | MEDICATED_PATCH | Freq: Every day | TRANSDERMAL | 0 refills | Status: AC

## 2021-07-21 MED ORDER — DOXYCYCLINE HYCLATE 100 MG PO TABS: 100.0000 mg | ORAL_TABLET | Freq: Two times a day (BID) | ORAL | 0 refills | Status: AC

## 2021-07-21 MED ORDER — PAROXETINE HCL 10 MG PO TABS: 10.0000 mg | ORAL_TABLET | Freq: Every day | ORAL | 5 refills | Status: AC

## 2021-07-21 MED ORDER — BUPROPION HCL ER (SR) 100 MG PO TB12: ORAL_TABLET | ORAL | 5 refills | Status: DC

## 2021-07-21 NOTE — Progress Notes (Signed)
Subjective:  Patient ID: Wendy Mata, female    DOB: [birth year]  Age: 71 y.o. MRN: [id number]  CC: Follow-up (Cough x 2weeks, tobacco cessation, Hypothyroidism)  Cough This is a new problem. The current episode started 1 to 4 weeks ago. The problem has been gradually worsening. The problem occurs constantly. The cough is Productive of purulent sputum. Associated symptoms include chest pain, chills, headaches, nasal congestion, postnasal drip, shortness of breath and wheezing. Pertinent negatives include no ear congestion, ear pain, fever, heartburn, hemoptysis, myalgias, rash, rhinorrhea, sore throat, sweats or weight loss. The symptoms are aggravated by cold air and fumes. Risk factors for lung disease include smoking/tobacco exposure. She has tried OTC cough suppressant for the symptoms. The treatment provided mild relief. Her past medical history is significant for bronchitis.   Recent ED visit for palpitations and headache 1week ago. The following tests performed according to discharge summary: troponin, ECG, COVID pcr, BNP, CT head (normal per patient) Metoprolol 12.5mg  prescribed. States she was unable to tolerate medication. Report requested  Tobacco use disorder 1/2ppd x 53yrs, previously 1ppd x 89yrs Quit 8days ago due to acute URI symptoms.  Agreed to use nicotine patch 7mg  prn to manage craving Did not prescribe Wellbutrin due to risk of exacerbating anxious feeling F/up in 60month  Anxiety state Waxing and waning No SI/HI or psychosis Agreed to start paxil 10mg  daily F/up in 61month  Hypothyroidism Repeat TSH and T4 today  Depression screen [facility] 2/9 07/21/2021 05/21/2021 05/21/2021  Decreased Interest 0 0 0  Down, Depressed, Hopeless 0 0 0  PHQ - 2 Score 0 0 0  Altered sleeping 3 - -  Tired, decreased energy 0 - -  Change in appetite 2 - -  Feeling bad or failure about yourself  0 - -  Trouble concentrating 0 - -  Moving slowly or fidgety/restless 0 - -  Suicidal  thoughts 0 - -  PHQ-9 Score 5 - -  Difficult doing work/chores Somewhat difficult - -    GAD 7 : Generalized Anxiety Score 07/21/2021  Nervous, Anxious, on Edge 3  Control/stop worrying 2  Worry too much - different things 1  Trouble relaxing 3  Restless 3  Easily annoyed or irritable 2  Afraid - awful might happen 1  Total GAD 7 Score 15  Anxiety Difficulty Somewhat difficult     Wt Readings from Last 3 Encounters:  07/21/21 128 lb 12.8 oz (58.4 kg)  11/27/20 130 lb (59 kg)  03/13/20 139 lb (63 kg)     Reviewed past Medical, Social and Family history today.  Outpatient Medications Prior to Visit  Medication Sig Dispense Refill   aspirin EC 81 MG tablet Take 81 mg by mouth daily.     cetirizine (ZYRTEC) 10 MG tablet TAKE 1 TABLET BY MOUTH ONCE EVERY DAY AS NEEDED 30 tablet 0   Cholecalciferol (VITAMIN D) 1000 UNITS capsule Take 2,000 Units by mouth daily.     omeprazole (PRILOSEC) 20 MG capsule TAKE 1 CAPSULE(20 MG) BY MOUTH DAILY 90 capsule 1   SYNTHROID 100 MCG tablet Take 1 tablet (100 mcg total) by mouth daily before breakfast. 90 tablet 1   clonazePAM (KLONOPIN) 0.5 MG tablet Take 1 tablet (0.5 mg total) by mouth 3 (three) times daily as needed. for anxiety (Patient not taking: Reported on 05/21/2021) 90 tablet 0   No facility-administered medications prior to visit.    ROS See HPI  Objective:  BP 138/70 (BP Location: Left Arm, Patient Position:  Sitting, Cuff Size: Normal)    Pulse 76    Temp 98.2 F (36.8 C) (Temporal)    Ht 5' 1.75" (1.568 m)    Wt 128 lb 12.8 oz (58.4 kg)    SpO2 99%    BMI 23.75 kg/m   Physical Exam Vitals reviewed.  Constitutional:      General: She is not in acute distress. Neck:     Thyroid: No thyroid mass, thyromegaly or thyroid tenderness.  Cardiovascular:     Rate and Rhythm: Normal rate and regular rhythm.     Pulses: Normal pulses.     Heart sounds: Normal heart sounds.  Pulmonary:     Effort: Pulmonary effort is normal.      Breath sounds: No stridor. Rhonchi present. No wheezing or rales.  Musculoskeletal:     Cervical back: Normal range of motion and neck supple.     Right lower leg: No edema.     Left lower leg: No edema.  Lymphadenopathy:     Cervical: Cervical adenopathy present.     Right cervical: Superficial cervical adenopathy present.     Left cervical: Superficial cervical adenopathy present.  Neurological:     Mental Status: She is alert and oriented to person, place, and time.  Psychiatric:        Attention and Perception: Attention normal.        Mood and Affect: Mood is anxious.        Speech: Speech normal.        Behavior: Behavior is cooperative.        Thought Content: Thought content normal.        Cognition and Memory: Cognition and memory normal.   Assessment & Plan:  This visit occurred during the SARS-CoV-2 public health emergency.  Safety protocols were in place, including screening questions prior to the visit, additional usage of staff PPE, and extensive cleaning of exam room while observing appropriate contact time as indicated for disinfecting solutions.   Wendy Mata was seen today for follow-up.  Diagnoses and all orders for this visit:  Other specified hypothyroidism -     TSH -     T4, free -     CBC  Vitamin D deficiency  Tobacco use disorder -     doxycycline (VIBRA-TABS) 100 MG tablet; Take 1 tablet (100 mg total) by mouth 2 (two) times daily for 7 days. -     Discontinue: buPROPion ER (WELLBUTRIN SR) 100 MG 12 hr tablet; 1tab daily x 2weeks, then 1tab BID continuously, take with food -     DG Chest 2 View -     nicotine (NICODERM CQ - DOSED IN MG/24 HR) 7 mg/24hr patch; Place 1 patch (7 mg total) onto the skin daily.  Acute cough -     promethazine-dextromethorphan (PROMETHAZINE-DM) 6.25-15 MG/5ML syrup; Take 5 mLs by mouth 4 (four) times daily as needed for cough. -     doxycycline (VIBRA-TABS) 100 MG tablet; Take 1 tablet (100 mg total) by mouth 2 (two) times  daily for 7 days. -     DG Chest 2 View -     CBC -     Comprehensive metabolic panel  Anxiety state -     PARoxetine (PAXIL) 10 MG tablet; Take 1 tablet (10 mg total) by mouth daily.  Encounter for tobacco use cessation counseling -     Discontinue: buPROPion ER (WELLBUTRIN SR) 100 MG 12 hr tablet; 1tab daily x 2weeks, then 1tab BID continuously,  take with food -     nicotine (NICODERM CQ - DOSED IN MG/24 HR) 7 mg/24hr patch; Place 1 patch (7 mg total) onto the skin daily.  Encounter for lipid screening for cardiovascular disease -     Direct LDL   Problem List Items Addressed This Visit       Endocrine   Hypothyroidism - Primary    Repeat TSH and T4 today      Relevant Orders   TSH   T4, free   CBC     Other   Anxiety state    Waxing and waning No SI/HI or psychosis Agreed to start paxil 10mg  daily F/up in 42month      Relevant Medications   PARoxetine (PAXIL) 10 MG tablet   Tobacco use disorder    1/2ppd x 37yrs, previously 1ppd x 42yrs Quit 8days ago due to acute URI symptoms.  Agreed to use nicotine patch 7mg  prn to manage craving Did not prescribe Wellbutrin due to risk of exacerbating anxious feeling F/up in 3month      Relevant Medications   doxycycline (VIBRA-TABS) 100 MG tablet   nicotine (NICODERM CQ - DOSED IN MG/24 HR) 7 mg/24hr patch   Other Relevant Orders   DG Chest 2 View   Vitamin D deficiency   Other Visit Diagnoses     Acute cough       Relevant Medications   promethazine-dextromethorphan (PROMETHAZINE-DM) 6.25-15 MG/5ML syrup   doxycycline (VIBRA-TABS) 100 MG tablet   Other Relevant Orders   DG Chest 2 View   CBC   Comprehensive metabolic panel   Encounter for tobacco use cessation counseling       Relevant Medications   nicotine (NICODERM CQ - DOSED IN MG/24 HR) 7 mg/24hr patch   Encounter for lipid screening for cardiovascular disease       Relevant Orders   Direct LDL       Follow-up: Return in about 4 weeks (around  08/18/2021) for tobacco cessation and anxiety.  Wilfred Lacy, NP

## 2021-07-21 NOTE — Assessment & Plan Note (Signed)
Waxing and waning No SI/HI or psychosis Agreed to start paxil 10mg  daily F/up in 34month

## 2021-07-21 NOTE — Assessment & Plan Note (Addendum)
1/2ppd x 15yrs, previously 1ppd x 72yrs Quit 8days ago due to acute URI symptoms.  Agreed to use nicotine patch 7mg  prn to manage craving Did not prescribe Wellbutrin due to risk of exacerbating anxious feeling F/up in 68month

## 2021-07-21 NOTE — Assessment & Plan Note (Signed)
Repeat TSH and T4 today

## 2021-07-21 NOTE — Patient Instructions (Addendum)
Sign medical release to get records from [facility] ED.  Go to lab for blood draw and CXR.  Start doxycycline, promethazine DM and paxil as discussed. Use nicotine patch to help manage cravings.  Paroxetine Tablets What is this medication? PAROXETINE (pa ROX e teen) treats depression, anxiety, obsessive-compulsive disorder (OCD), post-traumatic stress disorder (PTSD), and premenstrual dysphoric disorder (PMDD). It increases the amount of serotonin in the brain, a hormone that helps regulate mood. It belongs to a group of medications called SSRIs. This medicine may be used for other purposes; ask your health care provider or pharmacist if you have questions. COMMON BRAND NAME(S): Paxil, Pexeva What should I tell my care team before I take this medication? They need to know if you have any of these conditions: Bipolar disorder or a family history of bipolar disorder Bleeding disorders Glaucoma Heart disease Kidney disease Liver disease Low levels of sodium in the blood Seizures Suicidal thoughts, plans, or attempt; a previous suicide attempt by you or a family member Take MAOIs like Carbex, Eldepryl, Marplan, Nardil, and Parnate Take medications that treat or prevent blood clots Thyroid disease An unusual or allergic reaction to paroxetine, other medications, foods, dyes, or preservatives Pregnant or trying to get pregnant Breast-feeding How should I use this medication? Take this medication by mouth with a glass of water. Follow the directions on the prescription label. You can take it with or without food. Take your medication at regular intervals. Do not take your medication more often than directed. Do not stop taking this medication suddenly except upon the advice of your care team. Stopping this medication too quickly may cause serious side effects or your condition may worsen. A special MedGuide will be given to you by the pharmacist with each prescription and refill. Be sure to read this  information carefully each time. Talk to your care team regarding the use of this medication in children. Special care may be needed. Overdosage: If you think you have taken too much of this medicine contact a poison control center or emergency room at once. NOTE: This medicine is only for you. Do not share this medicine with others. What if I miss a dose? If you miss a dose, take it as soon as you can. If it is almost time for your next dose, take only that dose. Do not take double or extra doses. What may interact with this medication? Do not take this medication with any of the following: Linezolid MAOIs like Carbex, Eldepryl, Marplan, Nardil, and Parnate Methylene blue (injected into a vein) Pimozide Thioridazine This medication may also interact with the following: Alcohol Amphetamines Aspirin and aspirin-like medications Atomoxetine Certain medications for depression, anxiety, or psychotic disturbances Certain medications for irregular heart beat like propafenone, flecainide, encainide, and quinidine Certain medications for migraine headache like almotriptan, eletriptan, frovatriptan, naratriptan, rizatriptan, sumatriptan, zolmitriptan Cimetidine Digoxin Diuretics Fentanyl Fosamprenavir Furazolidone Isoniazid Lithium Medications that treat or prevent blood clots like warfarin, enoxaparin, and dalteparin Medications for sleep NSAIDs, medications for pain and inflammation, like ibuprofen or naproxen Phenobarbital Phenytoin Procarbazine Rasagiline Ritonavir Supplements like St. John's wort, kava kava, valerian Tamoxifen Tramadol Tryptophan This list may not describe all possible interactions. Give your health care provider a list of all the medicines, herbs, non-prescription drugs, or dietary supplements you use. Also tell them if you smoke, drink alcohol, or use illegal drugs. Some items may interact with your medicine. What should I watch for while using this  medication? Tell your care team if your symptoms do  not get better or if they get worse. Visit your care team for regular checks on your progress. Because it may take several weeks to see the full effects of this medication, it is important to continue your treatment as prescribed by your care team. Watch for new or worsening thoughts of suicide or depression. This includes sudden changes in mood, behaviors, or thoughts. These changes can happen at any time but are more common in the beginning of treatment or after a change in dose. Call your care team right away if you experience these thoughts or worsening depression. Manic episodes may happen in patients with bipolar disorder who take this medication. Watch for changes in feelings or behaviors such as feeling anxious, nervous, agitated, panicky, irritable, hostile, aggressive, impulsive, severely restless, overly excited and hyperactive, or trouble sleeping. These changes can happen at any time but are more common in the beginning of treatment or after a change in dose. Call your care team right away if you notice any of these symptoms. You may get drowsy or dizzy. Do not drive, use machinery, or do anything that needs mental alertness until you know how this medication affects you. Do not stand or sit up quickly, especially if you are an older patient. This reduces the risk of dizzy or fainting spells. Alcohol may interfere with the effect of this medication. Avoid alcoholic drinks. Your mouth may get dry. Chewing sugarless gum or sucking hard candy, and drinking plenty of water will help. Contact your care team if the problem does not go away or is severe. What side effects may I notice from receiving this medication? Side effects that you should report to your care team as soon as possible: Allergic reactions--skin rash, itching, hives, swelling of the face, lips, tongue, or throat Bleeding--bloody or black, tar-like stools, red or dark brown urine,  vomiting blood or brown material that looks like coffee grounds, small, red or purple spots on skin, unusual bleeding or bruising Heart rhythm changes--fast or irregular heartbeat, dizziness, feeling faint or lightheaded, chest pain, trouble breathing Low sodium level--muscle weakness, fatigue, dizziness, headache, confusion Serotonin syndrome--irritability, confusion, fast or irregular heartbeat, muscle stiffness, twitching muscles, sweating, high fever, seizures, chills, vomiting, diarrhea Sudden eye pain or change in vision such as blurry vision, seeing halos around lights, vision loss Thoughts of suicide or self-harm, worsening mood, feelings of depression Side effects that usually do not require medical attention (report to your care team if they continue or are bothersome): Change in sex drive or performance Diarrhea Excessive sweating Nausea Tremors or shaking Upset stomach This list may not describe all possible side effects. Call your doctor for medical advice about side effects. You may report side effects to FDA at 1-800-FDA-1088. Where should I keep my medication? Keep out of the reach of children and pets. Store at room temperature between 15 and 30 degrees C (59 and 86 degrees F). Keep container tightly closed. Throw away any unused medication after the expiration date. NOTE: This sheet is a summary. It may not cover all possible information. If you have questions about this medicine, talk to your doctor, pharmacist, or health care provider.  2022 [employer] (2020-06-05 00:00:00)

## 2021-07-22 ENCOUNTER — Encounter: Payer: Self-pay | Admitting: Nurse Practitioner

## 2021-07-22 DIAGNOSIS — E038 Other specified hypothyroidism: Secondary | ICD-10-CM

## 2021-07-22 MED ORDER — SYNTHROID 100 MCG PO TABS: 100.0000 ug | ORAL_TABLET | Freq: Every day | ORAL | 1 refills | Status: AC

## 2021-07-22 NOTE — Addendum Note (Signed)
Addended by: Leana Gamer on: 07/22/2021 03:47 PM   Modules accepted: Orders

## 2021-07-23 ENCOUNTER — Encounter: Payer: Self-pay | Admitting: Nurse Practitioner

## 2021-07-23 DIAGNOSIS — E038 Other specified hypothyroidism: Secondary | ICD-10-CM

## 2021-07-29 DIAGNOSIS — H524 Presbyopia: Secondary | ICD-10-CM | POA: Diagnosis not present

## 2021-07-29 DIAGNOSIS — H35423 Microcystoid degeneration of retina, bilateral: Secondary | ICD-10-CM | POA: Diagnosis not present

## 2021-08-21 MED ORDER — CETIRIZINE HCL 10 MG PO TABS: 10.0000 mg | ORAL_TABLET | Freq: Every day | ORAL | 0 refills | Status: DC

## 2021-11-27 ENCOUNTER — Other Ambulatory Visit: Payer: Self-pay | Admitting: Nurse Practitioner

## 2021-11-27 NOTE — Telephone Encounter (Signed)
Last OV: 06/2021

## 2022-02-23 NOTE — Telephone Encounter (Signed)
Na

## 2022-02-27 DIAGNOSIS — M8580 Other specified disorders of bone density and structure, unspecified site: Secondary | ICD-10-CM | POA: Diagnosis not present

## 2022-02-27 DIAGNOSIS — E039 Hypothyroidism, unspecified: Secondary | ICD-10-CM | POA: Diagnosis not present

## 2022-02-27 DIAGNOSIS — F419 Anxiety disorder, unspecified: Secondary | ICD-10-CM | POA: Diagnosis not present

## 2022-02-27 DIAGNOSIS — J302 Other seasonal allergic rhinitis: Secondary | ICD-10-CM | POA: Diagnosis not present

## 2022-03-20 DIAGNOSIS — E039 Hypothyroidism, unspecified: Secondary | ICD-10-CM | POA: Diagnosis not present

## 2022-03-27 DIAGNOSIS — Z23 Encounter for immunization: Secondary | ICD-10-CM | POA: Diagnosis not present

## 2022-03-27 DIAGNOSIS — M8580 Other specified disorders of bone density and structure, unspecified site: Secondary | ICD-10-CM | POA: Diagnosis not present

## 2022-03-27 DIAGNOSIS — E039 Hypothyroidism, unspecified: Secondary | ICD-10-CM | POA: Diagnosis not present

## 2022-03-27 DIAGNOSIS — F419 Anxiety disorder, unspecified: Secondary | ICD-10-CM | POA: Diagnosis not present

## 2022-03-27 DIAGNOSIS — Z87891 Personal history of nicotine dependence: Secondary | ICD-10-CM | POA: Diagnosis not present

## 2022-03-27 DIAGNOSIS — E785 Hyperlipidemia, unspecified: Secondary | ICD-10-CM | POA: Diagnosis not present

## 2022-03-27 DIAGNOSIS — J302 Other seasonal allergic rhinitis: Secondary | ICD-10-CM | POA: Diagnosis not present

## 2022-04-19 DIAGNOSIS — Z6823 Body mass index (BMI) 23.0-23.9, adult: Secondary | ICD-10-CM | POA: Diagnosis not present

## 2022-04-19 DIAGNOSIS — H6123 Impacted cerumen, bilateral: Secondary | ICD-10-CM | POA: Diagnosis not present

## 2022-05-01 DIAGNOSIS — H66002 Acute suppurative otitis media without spontaneous rupture of ear drum, left ear: Secondary | ICD-10-CM | POA: Diagnosis not present

## 2022-05-01 DIAGNOSIS — Z6823 Body mass index (BMI) 23.0-23.9, adult: Secondary | ICD-10-CM | POA: Diagnosis not present

## 2022-05-26 ENCOUNTER — Telehealth: Payer: Self-pay | Admitting: Nurse Practitioner

## 2022-05-26 NOTE — Telephone Encounter (Signed)
Left message for patient to call back and schedule Medicare Annual Wellness Visit (AWV) in office.   If not able to come in office, please offer to do virtually or by telephone.  Left office number and my jabber [phone].  Last AWV:05/21/2021   Please schedule at anytime with Nurse Health Advisor.

## 2022-06-15 ENCOUNTER — Telehealth: Payer: Self-pay | Admitting: Nurse Practitioner

## 2022-06-15 NOTE — Telephone Encounter (Signed)
Left message for patient to call back and schedule Medicare Annual Wellness Visit (AWV) in office.   If not able to come in office, please offer to do virtually or by telephone.  Left office number and my jabber [phone].  Last AWV:05/21/2021   Please schedule at anytime with Nurse Health Advisor.

## 2022-07-01 ENCOUNTER — Telehealth: Payer: Self-pay

## 2022-07-01 NOTE — Telephone Encounter (Signed)
PCP no longer at this office.

## 2022-08-25 DIAGNOSIS — H43393 Other vitreous opacities, bilateral: Secondary | ICD-10-CM | POA: Diagnosis not present

## 2022-08-25 DIAGNOSIS — H16103 Unspecified superficial keratitis, bilateral: Secondary | ICD-10-CM | POA: Diagnosis not present

## 2022-08-25 DIAGNOSIS — H2513 Age-related nuclear cataract, bilateral: Secondary | ICD-10-CM | POA: Diagnosis not present

## 2022-08-25 DIAGNOSIS — E039 Hypothyroidism, unspecified: Secondary | ICD-10-CM | POA: Diagnosis not present

## 2022-09-09 DIAGNOSIS — H524 Presbyopia: Secondary | ICD-10-CM | POA: Diagnosis not present

## 2022-09-18 DIAGNOSIS — E039 Hypothyroidism, unspecified: Secondary | ICD-10-CM | POA: Diagnosis not present

## 2022-09-25 DIAGNOSIS — F419 Anxiety disorder, unspecified: Secondary | ICD-10-CM | POA: Diagnosis not present

## 2022-09-25 DIAGNOSIS — Z7952 Long term (current) use of systemic steroids: Secondary | ICD-10-CM | POA: Diagnosis not present

## 2022-09-25 DIAGNOSIS — E785 Hyperlipidemia, unspecified: Secondary | ICD-10-CM | POA: Diagnosis not present

## 2022-09-25 DIAGNOSIS — Z79890 Hormone replacement therapy (postmenopausal): Secondary | ICD-10-CM | POA: Diagnosis not present

## 2022-09-25 DIAGNOSIS — Z87891 Personal history of nicotine dependence: Secondary | ICD-10-CM | POA: Diagnosis not present

## 2022-09-25 DIAGNOSIS — E039 Hypothyroidism, unspecified: Secondary | ICD-10-CM | POA: Diagnosis not present

## 2022-09-25 DIAGNOSIS — T7849XA Other allergy, initial encounter: Secondary | ICD-10-CM | POA: Diagnosis not present

## 2022-09-25 DIAGNOSIS — Z79899 Other long term (current) drug therapy: Secondary | ICD-10-CM | POA: Diagnosis not present

## 2022-09-25 DIAGNOSIS — M8580 Other specified disorders of bone density and structure, unspecified site: Secondary | ICD-10-CM | POA: Diagnosis not present
